# Patient Record
Sex: Female | Born: 1940 | Race: White | Hispanic: No | Smoking: Former smoker
Health system: Southern US, Community
[De-identification: ages and names within clinical notes are randomized; demographics above are authoritative.]

## PROBLEM LIST (undated history)

## (undated) DIAGNOSIS — D126 Benign neoplasm of colon, unspecified: Secondary | ICD-10-CM

## (undated) DIAGNOSIS — A630 Anogenital (venereal) warts: Secondary | ICD-10-CM

## (undated) DIAGNOSIS — I10 Essential (primary) hypertension: Secondary | ICD-10-CM

## (undated) DIAGNOSIS — M1990 Unspecified osteoarthritis, unspecified site: Secondary | ICD-10-CM

## (undated) DIAGNOSIS — K219 Gastro-esophageal reflux disease without esophagitis: Secondary | ICD-10-CM

## (undated) HISTORY — DX: Gastro-esophageal reflux disease without esophagitis: K21.9

## (undated) HISTORY — DX: Anogenital (venereal) warts: A63.0

## (undated) HISTORY — DX: Benign neoplasm of colon, unspecified: D12.6

## (undated) HISTORY — DX: Unspecified osteoarthritis, unspecified site: M19.90

## (undated) HISTORY — DX: Essential (primary) hypertension: I10

## (undated) HISTORY — PX: COLONOSCOPY: SHX174

## (undated) HISTORY — PX: CATARACT EXTRACTION: SUR2

---

## 1975-12-14 HISTORY — PX: VAGINAL HYSTERECTOMY: SUR661

## 1998-09-19 ENCOUNTER — Emergency Department (HOSPITAL_COMMUNITY): Admission: EM | Admit: 1998-09-19 | Discharge: 1998-09-19 | Payer: Self-pay | Admitting: Family Medicine

## 1999-05-14 ENCOUNTER — Emergency Department (HOSPITAL_COMMUNITY): Admission: EM | Admit: 1999-05-14 | Discharge: 1999-05-14 | Payer: Self-pay | Admitting: Emergency Medicine

## 2002-05-15 ENCOUNTER — Ambulatory Visit (HOSPITAL_COMMUNITY): Admission: RE | Admit: 2002-05-15 | Discharge: 2002-05-15 | Payer: Self-pay | Admitting: Internal Medicine

## 2002-08-03 ENCOUNTER — Other Ambulatory Visit: Admission: RE | Admit: 2002-08-03 | Discharge: 2002-08-03 | Payer: Self-pay | Admitting: Obstetrics and Gynecology

## 2004-01-08 ENCOUNTER — Other Ambulatory Visit: Admission: RE | Admit: 2004-01-08 | Discharge: 2004-01-08 | Payer: Self-pay | Admitting: Obstetrics and Gynecology

## 2004-07-07 ENCOUNTER — Other Ambulatory Visit: Admission: RE | Admit: 2004-07-07 | Discharge: 2004-07-07 | Payer: Self-pay | Admitting: Obstetrics and Gynecology

## 2005-07-19 ENCOUNTER — Other Ambulatory Visit: Admission: RE | Admit: 2005-07-19 | Discharge: 2005-07-19 | Payer: Self-pay | Admitting: Obstetrics and Gynecology

## 2005-12-01 ENCOUNTER — Ambulatory Visit: Admission: RE | Admit: 2005-12-01 | Discharge: 2005-12-01 | Payer: Self-pay | Admitting: Gynecology

## 2006-01-11 ENCOUNTER — Ambulatory Visit (HOSPITAL_COMMUNITY): Admission: RE | Admit: 2006-01-11 | Discharge: 2006-01-11 | Payer: Self-pay | Admitting: Family Medicine

## 2006-01-11 ENCOUNTER — Encounter (INDEPENDENT_AMBULATORY_CARE_PROVIDER_SITE_OTHER): Payer: Self-pay | Admitting: *Deleted

## 2006-03-18 ENCOUNTER — Ambulatory Visit: Admission: RE | Admit: 2006-03-18 | Discharge: 2006-03-18 | Payer: Self-pay | Admitting: Gynecology

## 2006-10-17 ENCOUNTER — Other Ambulatory Visit: Admission: RE | Admit: 2006-10-17 | Discharge: 2006-10-17 | Payer: Self-pay | Admitting: Obstetrics and Gynecology

## 2007-08-03 ENCOUNTER — Other Ambulatory Visit: Admission: RE | Admit: 2007-08-03 | Discharge: 2007-08-03 | Payer: Self-pay | Admitting: Obstetrics and Gynecology

## 2008-01-30 ENCOUNTER — Ambulatory Visit: Payer: Self-pay | Admitting: Internal Medicine

## 2008-02-13 ENCOUNTER — Encounter: Payer: Self-pay | Admitting: Internal Medicine

## 2008-02-13 ENCOUNTER — Ambulatory Visit: Payer: Self-pay | Admitting: Internal Medicine

## 2008-03-14 ENCOUNTER — Other Ambulatory Visit: Admission: RE | Admit: 2008-03-14 | Discharge: 2008-03-14 | Payer: Self-pay | Admitting: Obstetrics and Gynecology

## 2009-06-12 ENCOUNTER — Other Ambulatory Visit: Admission: RE | Admit: 2009-06-12 | Discharge: 2009-06-12 | Payer: Self-pay | Admitting: Obstetrics and Gynecology

## 2009-08-05 ENCOUNTER — Ambulatory Visit: Payer: Self-pay | Admitting: Vascular Surgery

## 2009-11-10 ENCOUNTER — Ambulatory Visit: Payer: Self-pay | Admitting: Surgery

## 2010-02-03 ENCOUNTER — Ambulatory Visit: Payer: Self-pay | Admitting: Vascular Surgery

## 2010-05-26 ENCOUNTER — Ambulatory Visit: Payer: Self-pay | Admitting: Vascular Surgery

## 2010-06-29 ENCOUNTER — Ambulatory Visit: Payer: Self-pay | Admitting: Vascular Surgery

## 2010-07-08 ENCOUNTER — Ambulatory Visit: Payer: Self-pay | Admitting: Vascular Surgery

## 2010-08-03 ENCOUNTER — Ambulatory Visit: Payer: Self-pay | Admitting: Vascular Surgery

## 2011-03-18 ENCOUNTER — Other Ambulatory Visit: Payer: Self-pay | Admitting: Obstetrics and Gynecology

## 2011-03-18 ENCOUNTER — Other Ambulatory Visit (HOSPITAL_COMMUNITY)
Admission: RE | Admit: 2011-03-18 | Discharge: 2011-03-18 | Disposition: A | Discharge status: Home / Self Care | Payer: Medicare Other | Source: Ambulatory Visit | Attending: Obstetrics and Gynecology | Admitting: Obstetrics and Gynecology

## 2011-03-18 DIAGNOSIS — Z124 Encounter for screening for malignant neoplasm of cervix: Secondary | ICD-10-CM | POA: Insufficient documentation

## 2011-04-27 NOTE — Assessment & Plan Note (Signed)
OFFICE VISIT   Tammy [name], Tammy [name]  DOB:  [birth year]                                       06/29/2010  ZOXWR#:[id number]   The patient underwent laser ablation of her left great saphenous vein  for venous hypertension and history of thrombophlebitis in the left leg.  She tolerated the procedure well and will return on July 27th for venous  duplex exam, to be seen by Dr. Arbie Cookey at that time for followup.     Quita Skye Hart [place], M.D.  Electronically Signed   JDL/MEDQ  D:  06/29/2010  T:  06/30/2010  Job:  [id number]

## 2011-04-27 NOTE — Procedures (Signed)
DUPLEX DEEP VENOUS EXAM - LOWER EXTREMITY   INDICATION:  Left greater saphenous vein ELAS on 06/29/2010.   HISTORY:  Edema:  Yes  Trauma/Surgery:  Left greater saphenous vein ELAS 06/29/2010  Pain:  Yes  PE:  No  Previous DVT:  Yes  Anticoagulants:  No   DUPLEX EXAM:                CFV   SFV       PopV       PTV   GSV                R  L  R  L      R  L       R  L  R  L  Thrombosis    0  0     P         P          P     +  Spontaneous   +  +     +         +          D     0  Phasic        +  +     +         +          D     0  Augmentation  +  +     +         +          D     0  Compressible  +  +     Partial in distal    Partial in proximal  P                0  Competent     +  +     +         +          +     0   Legend:  + - yes  o - no  p - partial  D - decreased   IMPRESSION:  1. The left leg appears to have traces of old thrombus in distal      superficial femoral artery, proximal popliteal, and peroneal vein      with partial compression and mildly diminished flow.  2. Left greater saphenous vein closed off due to ELAS procedure.  3. Right SFJ and common femoral vein appear to be negative for deep      vein thrombosis.  4. Somewhat limited due to patient body habitus, not all the calf      veins were visualized due to increased edema.    _____________________________  Quita Skye. Hart [place], M.D.   NT/MEDQ  D:  08/04/2010  T:  08/04/2010  Job:  [id number]

## 2011-04-27 NOTE — Assessment & Plan Note (Signed)
OFFICE VISIT   Tammy [name], Tammy [name]  DOB:  11-Nov-1941                                       05/26/2010  ZOXWR#:[id number]   The patient returns today for further followup regarding her venous  insufficiency left greater than right leg.  She has had previous  thrombophlebitis in the left leg and has had painful venous hypertension  in the thigh and calf consisting of aching, throbbing and burning  discomfort.  She has tried wearing long-leg elastic compression  stockings (20 mm - 30 mm gradient) as well as elevating her leg and  trying ibuprofen but has had no success in relieving her symptoms.  She  has had her Coumadin discontinued a few months ago which she had taken  for 8 months for the superficial thrombophlebitis.   On examination today she has an excellent femoral, popliteal and  dorsalis pedis pulse in the left leg with extensive reticular and spider  veins below the knee and some small bulging varicosities in the calf  where the thrombophlebitis occurred.  She does have 1-2+ edema in the  ankle.  A venous duplex exam last visit revealed gross reflux in the  left saphenofemoral junction from the groin to the knee feeding these  areas of venous hypertension.  Deep system had no evidence of DVT and  she does have a +5 dorsalis pedis pulse.  I think the best plan would be  to treat her with laser ablation of her left great saphenous vein and we  will proceed with precertification to perform that in the near future.     Quita Skye Hart [place], M.D.  Electronically Signed   JDL/MEDQ  D:  05/26/2010  T:  05/27/2010  Job:  [id number]

## 2011-04-27 NOTE — Procedures (Signed)
LOWER EXTREMITY VENOUS REFLUX EXAM   INDICATION:  Bilateral lower extremity swelling, pain, varicose veins,  history of DVT and phlebitis.   EXAM:  Using color-flow imaging and pulse Doppler spectral analysis, the  bilateral common femoral, superficial femoral, popliteal, posterior  tibial, greater and lesser saphenous veins are evaluated.  There is no  evidence suggesting deep venous insufficiency in the bilateral lower  extremities.   The bilateral saphenofemoral junction is not competent with reflux of  >570milliseconds. The bilateral GSV is not competent with reflux of  >544milliseconds with the caliber as described below.)   The bilateral proximal short saphenous vein demonstrates competency.   GSV Diameter (used if found to be incompetent only)                                            Right    Left  Proximal Greater Saphenous Vein           0.90 cm  1.15 cm  Proximal-to-mid-thigh                     0.62 cm  0.80 cm  Mid thigh                                 0.58 cm  0.87 cm  Mid-distal thigh                          cm       cm  Distal thigh                              0.47 cm  1.19 cm  Knee                                      0.38 cm  0.65 cm   IMPRESSION:  1. Bilateral greater saphenous vein reflux with >500 milliseconds, is      identified with the caliber ranging from 0.38 cm to 0.90 cm knee to      groin on the right and 0.65 to 1.15 cm on the left.  2. The bilateral greater saphenous vein is not aneurysmal.  3. The bilateral greater saphenous vein is not tortuous.  4. The deep venous system is competent.  5. The bilateral lesser saphenous vein is competent.  6. There is an anterior accessory saphenous vein identified on the      right with reflux of >500 milliseconds measuring 0.62 cm at the      saphenofemoral junction and 0.55 cm to the proximal thigh,      contributing to varicose veins.       ___________________________________________  Quita Skye Hart [place], M.D.   CJ/MEDQ  D:  02/03/2010  T:  02/04/2010  Job:  [id number]

## 2011-04-27 NOTE — Procedures (Signed)
DUPLEX DEEP VENOUS EXAM - LOWER EXTREMITY   INDICATION:  Right lower ext pain/swelling, rule out DVT.   HISTORY:  Edema:  3-4 weeks of swelling at the right knee level  Trauma/Surgery:  no  Pain:  Right knee pain  PE:  no  Previous DVT:  Left lower extremity DVT in 07/2009  Anticoagulants:  yes  Other:   REFERRING PHYSICIAN:  Geoffry Paradise, M.D.   DUPLEX EXAM:                CFV   SFV   PopV  PTV    GSV                R  L  R  L  R  L  R   L  R  L  Thrombosis    o  o  o     o     o      o  Spontaneous   +  +  +     +     +      +  Phasic        +  +  +     +     +      +  Augmentation  +  +  +     +     +      +  Compressible  +  +  +     +     +      +  Competent   Legend:  + - yes  o - no  p - partial  D - decreased   IMPRESSION:  1. No evidence of deep venous thrombosis noted in the right lower      extremity.  2. There is a Baker cyst noted in the left popliteal fossa.   A preliminary report was called to Dr. Lanell Matar office on 11/10/2009  and given to Diane.    _____________________________  V. Charlena Cross, MD   CH/MEDQ  D:  11/11/2009  T:  11/12/2009  Job:  [id number]   cc:   Geoffry Paradise, M.D.

## 2011-04-27 NOTE — Assessment & Plan Note (Signed)
OFFICE VISIT   ORA, BOLLIG  DOB:  1941-01-22                                       07/08/2010  ZOXWR#:[id number]   The patient presents today for followup of her laser ablation of her  left great saphenous vein on 06/29/2010 for venous hypertension.  She  has done extremely well since her procedure with minimal discomfort.  She does have the typical irritation from the compression garment that  she has worn as directed since the procedure.  She has mild bruising in  her medial thigh.   She underwent repeat venous duplex in our office today and this shows  closure of her great saphenous vein from the knee to the saphenofemoral  junction.  She does have evidence of old thrombus in her femoral vein  and popliteal vein and peroneal vein.  This is consistent with the area  of clot when she was diagnosed with DVT 1 year ago.  She does not appear  to have any new evidence of thrombus.  She will continue her compression  for a total of 2 weeks after the procedure and will see [place] again on an  as-needed basis.     Larina Earthly, M.D.  Electronically Signed   TFE/MEDQ  D:  07/08/2010  T:  07/09/2010  Job:  [id number]   cc:   Geoffry Paradise, M.D.  Quita Skye Hart [place], M.D.

## 2011-04-27 NOTE — Procedures (Signed)
DUPLEX DEEP VENOUS EXAM - LOWER EXTREMITY   INDICATION:  Left lower extremity pain and swelling   HISTORY:  Edema:  yes  Trauma/Surgery:  no  Pain:  yes  PE:  no  Previous DVT:  yes  Anticoagulants:  Other:   DUPLEX EXAM:                CFV   SFV   PopV  PTV    GSV                R  L  R  L  R  L  R   L  R   L  Thrombosis    0  0     +     +      +  +   +  Spontaneous      +     0     0      0      0  Phasic           +     0     0      0      +  Augmentation     +     0     0      0      +  Compressible     +     0     0      0      +  Competent        +     0     0      0      +   Legend:  + - yes  o - no  p - partial  D - decreased   IMPRESSION:  Acute deep venous thrombosis noted in the left superficial  femoral vein, popliteal, gastrocnemius and tibial veins.   Notify Diane with results.    _____________________________  Di Kindle. Edilia Bo, M.D.   MG/MEDQ  D:  08/05/2009  T:  08/05/2009  Job:  [id number]

## 2011-04-27 NOTE — Procedures (Signed)
DUPLEX DEEP VENOUS EXAM - LOWER EXTREMITY   INDICATION:  Followup left greater saphenous vein ELAS.   HISTORY:  Edema:  Bilateral.  Trauma/Surgery:  ELAS of left greater saphenous vein on 06/29/2010.  Pain:  No pain just tender to touch.  PE:  No.  Previous DVT:  Yes.  Anticoagulants:  Not currently.  Other:   DUPLEX EXAM:                CFV   SFV       PopV       PTV   GSV                R  L  R  L      R  L       R  L  R  L  Thrombosis    0  0     P         Trace      P     +  Spontaneous   +  +     +         +          D     0  Phasic        +  +     +         +          D     0  Augmentation  +  +     +         +          D     0                 Compressible      +     +           Partial  in distal        Partial in proximal        P     0  Competent     +  +     +         +          +     0   Legend:  + - yes  o - no  p - partial  D - decreased   IMPRESSION:  1. The left leg appears to have traces of old thrombus in the distal      superficial femoral vein, proximal popliteal vein and in the      peroneal veins with partial compression and mildly diminished flow.  2. The left greater saphenous vein was closed off with recent ELAS      procedure.  3. The right saphenofemoral junction and common femoral vein appear to      be negative for deep venous thrombosis.  4. Somewhat limited due to patient's body habitus, not all the calf      veins were visualized.    _____________________________  Larina Earthly, M.D.   NT/MEDQ  D:  07/08/2010  T:  07/08/2010  Job:  [id number]

## 2011-04-27 NOTE — Consult Note (Signed)
NEW PATIENT CONSULTATION   Tammy, [name]  DOB:  1941/02/18                                       02/03/2010  ZOXWR#:[id number]   The patient is a 70 year old female referred by Dr. Jacky Kindle for painful,  swollen left lower extremity with a history of superficial  thrombophlebitis in the past.  This patient has been having fairly  constant swelling and discomfort in the left thigh and calf area over  the last several months with a throbbing and aching discomfort.  She  developed swelling in the left ankle which worsens as the day  progresses.  She has tried short leg elastic compression stockings with  no improvement.  She had an episode of superficial thrombophlebitis and  was placed on Coumadin therapy in August 2010 and her symptoms have  persisted.  She has minimal symptoms the contralateral right leg.   CHRONIC MEDICAL PROBLEMS:  1. Hypertension.  2. Hyperlipidemia.  3. History of thrombophlebitis currently on Coumadin.  4. Type 2 diabetes mellitus.   PAST SURGICAL HISTORY:  Hysterectomy.   FAMILY HISTORY:  Positive for coronary artery disease in her mother and  brothers and a son who had an MI at age 45.  Negative for diabetes and  stroke.   SOCIAL HISTORY:  She is widowed, has three children.  Has not smoked  since 1987 and does not use alcohol.   REVIEW OF SYSTEMS:  Negative for weight loss, anorexia, chest pain,  dyspnea on exertion.  Has occasional palpitations.  History of joint  pain and the venous symptoms as noted above.  All other systems are  negative in the review of systems.   PHYSICAL EXAMINATION:  Vital signs:  Blood pressure 123/76, heart rate  74, temperature 97.9.  General:  She is a well-developed, well-nourished  female in no apparent distress.  HEENT:  EOMs intact.  Conjunctive  normal.  Lungs:  Are clear.  No wheezing.  Cardiovascular:  Regular  rhythm.  No murmurs.  Neck:  Neck is supple with carotid pulses 3+, no  bruits.   Abdomen:  Soft, nontender with no masses.  Musculoskeletal:  Exam revealed no major deformities.  Neurological:  Normal.  Skin:  Free  of rashes.  Lower extremity:  Exam reveals evidence of venous  hypertension in both legs, left worse than right with prominent  reticular and spider veins particularly in the distal thigh and proximal  calf and one to two plus edema in the left ankle area, one plus on the  right.  No evidence of any gangrene or infection.  She has 2+ dorsalis  pedis and posterior tibial pulses bilaterally.   Today I ordered a venous duplex exam which I reviewed and interpreted.  Left leg has gross reflux in the saphenofemoral junction to the knee  with a large saphenous vein, few areas which appear to have been  recanalized from previous thrombophlebitis but are patent.  Deep venous  system is normal on the left, no DVT.  Right leg has no DVT in the deep  system and also has some reflux in the great saphenous system although  the vein is smaller than on the left side.  Small saphenous veins are  normal bilaterally.   I think this patient is having pain in the left leg from venous  hypertension.  We will treat her  with long leg elastic compression  stockings (20 mm - 30 mm gradient) as well as elevation of the legs and  ibuprofen on a daily basis.  She will return in 3 months.  If there has  been no improvement I think she would be a good candidate for laser  ablation of the left great saphenous vein with multiple stab  phlebectomies.  Will see her back in 3 months for further followup.     Quita Skye Hart [place], M.D.  Electronically Signed   JDL/MEDQ  D:  02/03/2010  T:  02/04/2010  Job:  [id number]   cc:   Geoffry Paradise, M.D.

## 2011-04-27 NOTE — Assessment & Plan Note (Signed)
OFFICE VISIT   Tammy [name], Tammy [name]  DOB:  1941/10/16                                       08/03/2010  ZOXWR#:[id number]   The patient returns today complaining of some edema in the left leg  below the knee which has progressed slightly since her procedure done on  July 18 in which she had laser ablation of left great saphenous vein.  She had no discomfort associated with the laser ablation procedure in  the thigh or inguinal area.  States the swelling in the left leg is  slightly worse than it was prior to her procedure.  She is continuing to  wear short-leg elastic compression stockings.  She does have a remote  history of DVT in the past with evidence of this in her previous  ultrasound studies.   PHYSICAL EXAMINATION:  On examination today blood pressure 131/81, heart  rate 96, respirations 18.  She has 3+ femoral, popliteal and dorsalis  pedis pulses bilaterally.  Left leg has mild edema from the knee to the  ankle with no bulging varicosities noted.  There is no stasis ulcers or  infection.   Today I ordered venous duplex exam which I have reviewed and  interpreted.  There is no evidence of any new changes in her deep  system.  She does have some evidence of old DVT in the left superficial  femoral vein, but nonocclusive and unchanged from previous studies.  Left great saphenous vein remained closed and there is no evidence of  DVT.  I reassured her regarding these findings.  She will continue to  elevate the foot of her bed at night while sleeping and place elastic  compression stockings on early in the morning.  She will return in a few  months for sclerotherapy of her spider and reticular veins which she had  discussed previously.     Quita Skye Hart [place], M.D.  Electronically Signed   JDL/MEDQ  D:  08/03/2010  T:  08/04/2010  Job:  [id number]

## 2011-04-30 NOTE — Consult Note (Signed)
Tammy [name], Tammy [name] NO.:  [id number]   MEDICAL RECORD NO.:  [id number]          PATIENT TYPE:  OUT   LOCATION:  GYN                          FACILITY:  [facility]   PHYSICIAN:  De Blanch, M.D.DATE OF BIRTH:  09-26-1941   DATE OF CONSULTATION:  03/18/2006  DATE OF DISCHARGE:                                   CONSULTATION   CHIEF COMPLAINT:  Postoperative check.   INTERVAL HISTORY:  The patient underwent CO2 laser vaporization of 3 vulvar  lesions as well as a wide local excision of the left posterior vulva on  January 11, 2006.  She has had an uncomplicated postoperative course using  Sitz baths and Silvadene cream.  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  Today the patient reports she has completely healed and  has returned to full levels of activity.   PHYSICAL EXAMINATION:  VITAL SIGNS: Weight 239 pounds.  PELVIC:  External genitalia shows that all of the laser sites are well  healed as well as the area of wide local excision on the left posterior  vault.  There are no new lesions noted.   IMPRESSION:  VIN III status post laser vaporization and excision.  She has  healed well and can return to full levels of activity.  We will return her  to the care of her primary gynecologist, Dr. Artist Pais, and would  recommend the patient be examined every 6 months to exclude recurrent  disease.  It is noted the patient has a remote history of approximately 7  years ago of having a similar problem.  The patient is also aware of  symptoms associated with VIN III and will report to Dr. Thomasena Edis if she  develops any symptoms in the interval period.      De Blanch, M.D.  Electronically Signed     DC/MEDQ  D:  03/18/2006  T:  03/19/2006  Job:  [id number]   cc:   Artist Pais, M.D.  Fax: [phone]   Telford Nab, R.N.  [address]  [place], [place] [address]

## 2011-04-30 NOTE — Op Note (Signed)
Tammy [name], Tammy [name]                 ACCOUNT NO.:  [id number]   MEDICAL RECORD NO.:  [id number]          PATIENT TYPE:  AMB   LOCATION:  DAY                          [facility]ACILITY:  [facility]   PHYSICIAN:  De Blanch, M.D.DATE OF BIRTH:  1941/07/18   DATE OF PROCEDURE:  01/11/2006  DATE OF DISCHARGE:                                 OPERATIVE REPORT   PREOPERATIVE DIAGNOSIS:  Multifocal vulvar intraepithelial neoplasia grade  3.   POSTOPERATIVE DIAGNOSIS:  Multifocal vulvar intraepithelial neoplasia grade  3.   PROCEDURE:  CO2 laser vaporization of three vulvar lesions and wide local  excision of a left posterior vulvar lesion.   SURGEON:  De Blanch, M.D.   FIRST ASSISTANT:  Telford Nab, R.N.   ANESTHESIA:  General with oral tracheal tube.   ESTIMATED BLOOD LOSS:  Minimal.   FINDINGS:  Examination under anesthesia revealed a vulva which had  previously undergone extensive laser vaporization many years ago. After  applying acetic acid, there was dense white skin just to the right of the  clitoris. There were multiple lesions within approximately 3-1/2 cm area to  the left of the anterior vulva and a smaller lesion about the mid left  vulva. Finally there was a thickened area of white epithelium in the left  posterior vulva which ultimately was excised. She had small hemorrhoids, no  other abnormalities were noted.   DESCRIPTION OF PROCEDURE:  The patient was brought to the operating room and  after satisfactory attainment of general anesthesia was placed in lithotomy  position in [place] stirrups. The perineum, vagina, vulva and anus were  prepped with Betadine and the patient was draped. Acetic acid was then used  to identify the vulvar lesions which had been previously identified on our  visit with the patient December 01, 2005. The lesions were identified as  noted above.   Attention was first turned to the left posterior vulvar lesion which was  excised  in an elliptical fashion removing skin and a minimal amount of  subcutaneous tissue. The apex of the ellipse was marked with a suture for  pathologic orientation. The subcutaneous tissue was cauterized at bleeding  points and then 2-0 Vicryl was used to perform a subcuticular interrupted  closure. Three additional 3-0 Vicryl sutures were used as a mattress suture  through and through the skin.   Attention was turned to the anterior vulvar lesions which were lasered with  the CO2 laser. The CO2 laser was set at 20 watts of power in a continuous  mode. We lasered all areas with approximately a 5 mm margin around the gross  lesions. We lasered to the depth of the second surgical plane. All char was  washed away with acetic acid. Hemostasis was excellent, Silvadene cream was  applied. The patient was awakened from anesthesia and taken to the recovery  room in satisfactory condition. Sponge, needle and instrument counts correct  x2.      De Blanch, M.D.  Electronically Signed     DC/MEDQ  D:  01/11/2006  T:  01/12/2006  Job:  [id number]   cc:  Telford Nab, R.N.  [address]  [place], [place] [address]   Artist Pais, M.D.  Fax: [phone]

## 2011-04-30 NOTE — Consult Note (Signed)
[name], [name] NO.:  [id number]   MEDICAL RECORD NO.:  [id number]          PATIENT TYPE:  OUT   LOCATION:  GYN                          FACILITY:  [facility]   PHYSICIAN:  De Blanch, M.D.DATE OF BIRTH:  1941/03/21   DATE OF CONSULTATION:  12/01/2005  DATE OF DISCHARGE:  12/01/2005                                   CONSULTATION   CHIEF COMPLAINT:  High-grade dysplasia of the vulva.   HISTORY OF PRESENT ILLNESS:  The patient has had a longstanding history of  VIN 3, initially treated for several years by Dr. Verlon Au.  She  apparently had a partial vulvectomy at one point and subsequently 2  extensive laser procedures.  The patient has recently seen Dr. Artist Pais  who has identified lesions to the right side of the clitoris which on biopsy  is VIN 3.  She also obtained a biopsy of her raised, thickened area to the  right posterior vulva which showed mild dysplasia.  The patient herself is  asymptomatic at the present time.   PAST MEDICAL HISTORY:   MEDICAL ILLNESSES:  Hypertension and obesity.   DRUG ALLERGIES:  None.   CURRENT MEDICATIONS:  Benicar, Norvasc, and clobetasol cream p.r.n.   PAST SURGICAL HISTORY:  1.  Partial vulvectomy.  2.  Laser vaporization of the vulva on 2 occasions.  3.  Hysterectomy in 1977.   FAMILY HISTORY:  The patient has a mother with breast cancer.   OBSTETRICAL HISTORY:  Gravida 3.   SOCIAL HISTORY:  The patient is retired.  She does not smoke.   REVIEW OF SYSTEMS:  Ten-point comprehensive review of systems performed, is  negative except as noted above.   PHYSICAL EXAMINATION:  VITAL SIGNS:  Weight 239 pounds, height 5 feet 7  inches, blood pressure 120/80, pulse 80. GENERAL:  The patient is a healthy  white female in no acute distress.  HEENT is negative.  NECK:  Supple without thyromegaly.  There is no supraclavicular or inguinal  adenopathy.  ABDOMEN:  Soft, nontender.  No masses or  organomegaly, ascites, or hernias  are noted.  LOWER EXTREMITIES:  Without edema or varicosities.  PELVIC EXAM:  EG/BUS shows changes in the skin which seem to be from  extensive laser vaporization, especially in the posterior vulva and perianal  area.  She has an area of thickened skin at approximately 5 o'clock on the  right posterior vulva which has previously been biopsied.  Careful  inspection of the vulva after application of acetic acid shows an area of  white epithelium approximately 1 cm in diameter to the right of the clitoris  and a very focal area of white epithelium at the mid introitus on the left  side.   Colposcopic examination is performed following application of acetic acid,  and no additional lesions are noted.   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   PLAN:  I would recommend she had laser vaporization of the 2 upper lesions  and a wide excision of the posterior lesion.  We have arranged for this to  be performed on January 11, 2006 as an outpatient.      De Blanch, M.D.  Electronically Signed     DC/MEDQ  D:  12/01/2005  T:  12/04/2005  Job:  [id number]   cc:   Artist Pais, M.D.  Fax: [phone]   Telford Nab, R.N.  [address]  [place], [place] [address]

## 2011-07-02 ENCOUNTER — Encounter: Payer: Self-pay | Admitting: Internal Medicine

## 2011-08-13 ENCOUNTER — Encounter: Payer: Self-pay | Admitting: Internal Medicine

## 2011-08-13 ENCOUNTER — Ambulatory Visit (AMBULATORY_SURGERY_CENTER): Payer: Medicare Other | Admitting: *Deleted

## 2011-08-13 VITALS — Ht 67.0 in | Wt 243.0 lb

## 2011-08-13 DIAGNOSIS — Z1211 Encounter for screening for malignant neoplasm of colon: Secondary | ICD-10-CM

## 2011-08-13 MED ORDER — SUPREP BOWEL PREP KIT 17.5-3.13-1.6 GM/177ML PO SOLN: 1.0000 | ORAL | Status: DC

## 2011-08-27 ENCOUNTER — Other Ambulatory Visit: Payer: Medicare Other | Admitting: Internal Medicine

## 2011-09-01 ENCOUNTER — Ambulatory Visit (AMBULATORY_SURGERY_CENTER): Payer: Medicare Other | Admitting: Internal Medicine

## 2011-09-01 ENCOUNTER — Encounter: Payer: Self-pay | Admitting: Internal Medicine

## 2011-09-01 VITALS — BP 133/65 | HR 54 | Temp 98.5°F | Resp 18 | Ht 67.0 in | Wt 243.0 lb

## 2011-09-01 DIAGNOSIS — Z1211 Encounter for screening for malignant neoplasm of colon: Secondary | ICD-10-CM

## 2011-09-01 DIAGNOSIS — K635 Polyp of colon: Secondary | ICD-10-CM

## 2011-09-01 DIAGNOSIS — D126 Benign neoplasm of colon, unspecified: Secondary | ICD-10-CM

## 2011-09-01 DIAGNOSIS — Z86010 Personal history of colonic polyps: Secondary | ICD-10-CM

## 2011-09-01 MED ORDER — SODIUM CHLORIDE 0.9 % IV SOLN: 500.0000 mL | INTRAVENOUS | Status: DC

## 2011-09-01 NOTE — Patient Instructions (Signed)
Discharge instructions given with verbal understanding. Handouts on polyps given. Resume previous medications. 

## 2011-09-02 ENCOUNTER — Telehealth: Payer: Self-pay | Admitting: *Deleted

## 2011-09-02 NOTE — Telephone Encounter (Signed)
Follow up Call- Patient questions:  Do you have a fever, pain , or abdominal swelling? no Pain Score  0 *  Have you tolerated food without any problems? no  Have you been able to return to your normal activities? yes  Do you have any questions about your discharge instructions: Diet   no Medications  no Follow up visit  no  Do you have questions or concerns about your Care? no  Actions: * If pain score is 4 or above: No action needed, pain <4. 

## 2011-10-11 ENCOUNTER — Other Ambulatory Visit: Payer: Self-pay | Admitting: Obstetrics and Gynecology

## 2011-10-11 DIAGNOSIS — R609 Edema, unspecified: Secondary | ICD-10-CM

## 2011-10-12 ENCOUNTER — Ambulatory Visit
Admission: RE | Admit: 2011-10-12 | Discharge: 2011-10-12 | Disposition: A | Discharge status: Home / Self Care | Payer: Medicare Other | Source: Ambulatory Visit | Attending: Obstetrics and Gynecology | Admitting: Obstetrics and Gynecology

## 2011-10-12 DIAGNOSIS — R609 Edema, unspecified: Secondary | ICD-10-CM

## 2012-05-03 ENCOUNTER — Other Ambulatory Visit: Payer: Self-pay | Admitting: Obstetrics and Gynecology

## 2012-05-03 ENCOUNTER — Other Ambulatory Visit (HOSPITAL_COMMUNITY)
Admission: RE | Admit: 2012-05-03 | Discharge: 2012-05-03 | Disposition: A | Discharge status: Home / Self Care | Payer: Medicare Other | Source: Ambulatory Visit | Attending: Obstetrics and Gynecology | Admitting: Obstetrics and Gynecology

## 2012-05-03 DIAGNOSIS — Z01419 Encounter for gynecological examination (general) (routine) without abnormal findings: Secondary | ICD-10-CM | POA: Insufficient documentation

## 2012-05-03 DIAGNOSIS — R87810 Cervical high risk human papillomavirus (HPV) DNA test positive: Secondary | ICD-10-CM | POA: Insufficient documentation

## 2012-11-30 ENCOUNTER — Other Ambulatory Visit (HOSPITAL_COMMUNITY): Payer: Self-pay | Admitting: Cardiovascular Disease

## 2012-12-12 ENCOUNTER — Ambulatory Visit (HOSPITAL_COMMUNITY)
Admission: RE | Admit: 2012-12-12 | Discharge: 2012-12-12 | Disposition: A | Discharge status: Home / Self Care | Payer: Medicare Other | Source: Ambulatory Visit | Attending: Cardiovascular Disease | Admitting: Cardiovascular Disease

## 2012-12-12 DIAGNOSIS — R002 Palpitations: Secondary | ICD-10-CM | POA: Insufficient documentation

## 2012-12-12 DIAGNOSIS — Z8249 Family history of ischemic heart disease and other diseases of the circulatory system: Secondary | ICD-10-CM | POA: Insufficient documentation

## 2012-12-12 DIAGNOSIS — Z87891 Personal history of nicotine dependence: Secondary | ICD-10-CM | POA: Insufficient documentation

## 2012-12-12 DIAGNOSIS — I10 Essential (primary) hypertension: Secondary | ICD-10-CM | POA: Insufficient documentation

## 2012-12-12 DIAGNOSIS — R0609 Other forms of dyspnea: Secondary | ICD-10-CM | POA: Insufficient documentation

## 2012-12-12 DIAGNOSIS — R0989 Other specified symptoms and signs involving the circulatory and respiratory systems: Secondary | ICD-10-CM | POA: Insufficient documentation

## 2012-12-12 DIAGNOSIS — E669 Obesity, unspecified: Secondary | ICD-10-CM | POA: Insufficient documentation

## 2012-12-12 MED ORDER — TECHNETIUM TC 99M SESTAMIBI GENERIC - CARDIOLITE
30.5000 | Freq: Once | INTRAVENOUS | Status: AC | PRN
  Administered 2012-12-12: 31 via INTRAVENOUS

## 2012-12-12 MED ORDER — REGADENOSON 0.4 MG/5ML IV SOLN
0.4000 mg | Freq: Once | INTRAVENOUS | Status: AC
  Administered 2012-12-12: 0.4 mg via INTRAVENOUS

## 2012-12-12 MED ORDER — TECHNETIUM TC 99M SESTAMIBI GENERIC - CARDIOLITE
10.4000 | Freq: Once | INTRAVENOUS | Status: AC | PRN
  Administered 2012-12-12: 10 via INTRAVENOUS

## 2012-12-12 NOTE — Procedures (Addendum)
[facility] [address] [place] [address] [phone]  Cardiology Nuclear Med Study  Tammy [name] is a 71 y.o. female     MRN : [id number]     DOB: 1941-09-08  Procedure Date: 12/12/2012  Nuclear Med Background Indication for Stress Test:  Evaluation for Ischemia History:  no prior cardiac history Cardiac Risk Factors: Family History - CAD, History of Smoking, Hypertension and Obesity  Symptoms:  DOE and Palpitations   Nuclear Pre-Procedure Caffeine/Decaff Intake:  12:00am NPO After: 11:00am   IV Site: R Antecubital  IV 0.9% NS with Angio Cath:  22g  Chest Size (in):  n/a IV Started by: Koren Shiver, CNMT  Height: 5\' 7"  (1.702 m)  Cup Size: D  BMI:  Body mass index is 37.28 kg/(m^2). Weight:  238 lb (107.956 kg)   Tech Comments:  n/a    Nuclear Med Study 1 or 2 day study: 1 day  Stress Test Type:  Lexiscan  Order Authorizing Provider:  Nanetta Batty, MD   Resting Radionuclide: Technetium 34m Sestamibi  Resting Radionuclide Dose: 10.4 mCi   Stress Radionuclide:  Technetium 68m Sestamibi  Stress Radionuclide Dose: 30.5 mCi           Stress Protocol Rest HR: 57 Stress HR: 76  Rest BP: 117/77 Stress BP: 152/68  Exercise Time (min): n/a METS: n/a   Predicted Max HR: 147 bpm % Max HR: 51.7 bpm Rate Pressure Product: 86578   Dose of Adenosine (mg):  n/a Dose of Lexiscan: 0.4 mg  Dose of Atropine (mg): n/a Dose of Dobutamine: n/a mcg/kg/min (at max HR)  Stress Test Technologist: Esperanza Sheets, CCT Nuclear Technologist: Gonzella Lex, CNMT   Rest Procedure:  Myocardial perfusion imaging was performed at rest 45 minutes following the intravenous administration of Technetium 71m Sestamibi. Stress Procedure:  The patient received IV Lexiscan 0.4 mg over 15-seconds.  Technetium 72m Sestamibi injected at 30-seconds.  There were no significant changes with Lexiscan.  Quantitative spect images were obtained after  a 45 minute delay.  Transient Ischemic Dilatation (Normal <1.22):  0.95  Lung/Heart Ratio (Normal <0.45):  0.30 QGS EDV:  78 ml QGS ESV:  25 ml LV Ejection Fraction: 68%  Signed by      Rest ECG: NSR with poor R wave progression  Stress ECG: No significant change from baseline ECG and There are scattered PACs.  QPS Raw Data Images:  Mild breast attenuation.  Normal left ventricular size. Stress Images:  Normal homogeneous uptake in all areas of the myocardium. Rest Images:  Comparison with the stress images reveals no significant change. Subtraction (SDS):  There is a very small fixed anterior defect that is most consistent with breast attenuation. LV Wall Motion:  NL LV Function; NL Wall Motion EF 68%  Impression Exercise Capacity:  Lexiscan with no exercise. BP Response:  Normal blood pressure response. Clinical Symptoms:  No significant symptoms noted. ECG Impression:  No significant ST segment change suggestive of ischemia. Comparison with Prior Nuclear Study: No previous nuclear study performed  Overall Impression:  Normal stress nuclear study.   Thurmon Fair, MD  12/12/2012 5:00 PM

## 2013-04-13 ENCOUNTER — Encounter: Payer: Self-pay | Admitting: Cardiovascular Disease

## 2013-11-27 ENCOUNTER — Other Ambulatory Visit: Payer: Self-pay | Admitting: Obstetrics and Gynecology

## 2013-11-27 ENCOUNTER — Other Ambulatory Visit (HOSPITAL_COMMUNITY)
Admission: RE | Admit: 2013-11-27 | Discharge: 2013-11-27 | Disposition: A | Discharge status: Home / Self Care | Payer: Medicare Other | Source: Ambulatory Visit | Attending: Obstetrics and Gynecology | Admitting: Obstetrics and Gynecology

## 2013-11-27 DIAGNOSIS — Z124 Encounter for screening for malignant neoplasm of cervix: Secondary | ICD-10-CM | POA: Insufficient documentation

## 2014-11-27 ENCOUNTER — Other Ambulatory Visit (HOSPITAL_COMMUNITY)
Admission: RE | Admit: 2014-11-27 | Discharge: 2014-11-27 | Disposition: A | Discharge status: Home / Self Care | Payer: Medicare Other | Source: Ambulatory Visit | Attending: Obstetrics and Gynecology | Admitting: Obstetrics and Gynecology

## 2014-11-27 ENCOUNTER — Other Ambulatory Visit: Payer: Self-pay | Admitting: Obstetrics and Gynecology

## 2014-11-27 DIAGNOSIS — Z124 Encounter for screening for malignant neoplasm of cervix: Secondary | ICD-10-CM | POA: Diagnosis present

## 2014-12-02 LAB — CYTOLOGY - PAP

## 2015-07-09 ENCOUNTER — Encounter: Payer: Self-pay | Admitting: *Deleted

## 2015-08-11 ENCOUNTER — Encounter: Payer: Self-pay | Admitting: Cardiovascular Disease

## 2015-12-02 ENCOUNTER — Other Ambulatory Visit: Payer: Self-pay | Admitting: Obstetrics and Gynecology

## 2015-12-02 ENCOUNTER — Other Ambulatory Visit (HOSPITAL_COMMUNITY)
Admission: RE | Admit: 2015-12-02 | Discharge: 2015-12-02 | Disposition: A | Discharge status: Home / Self Care | Payer: Medicare Other | Source: Ambulatory Visit | Attending: Obstetrics and Gynecology | Admitting: Obstetrics and Gynecology

## 2015-12-02 DIAGNOSIS — R87810 Cervical high risk human papillomavirus (HPV) DNA test positive: Secondary | ICD-10-CM | POA: Insufficient documentation

## 2015-12-02 DIAGNOSIS — Z01419 Encounter for gynecological examination (general) (routine) without abnormal findings: Secondary | ICD-10-CM | POA: Insufficient documentation

## 2015-12-02 DIAGNOSIS — Z1151 Encounter for screening for human papillomavirus (HPV): Secondary | ICD-10-CM | POA: Diagnosis present

## 2015-12-12 LAB — CYTOLOGY - PAP

## 2016-05-12 NOTE — Progress Notes (Signed)
Cardiology Office Note   Date:  05/13/2016   ID:  Tammy [name], DOB 11/10/1941, MRN [id number]  PCP:  Geoffery Lyons, MD  Cardiologist:   Minus Breeding, MD   No chief complaint on file.     History of Present Illness: Tammy [name] is a 75 y.o. female who presents for evaluation of palpitations. She's had a long history of these. However, they've not been particularly symptomatic. However more recently she's noticing increasing palpitations. These are sporadic. They don't occur daily. However when she does have more irregular heartbeat she feels very fatigued. It happens at rest. She feels her heart skipping multiple beats sometimes in a row sometimes more sporadically. She does not have presyncope or syncope. She does not have chest pressure, neck or arm discomfort. She does not have shortness of breath, PND or orthopnea. She's not very active. She does grocery shopping rarely as her most physical activity. She doesn't have any limitations with this.    She's had no past cardiac history. She's had a couple of stress test. Last one was 2013 and was normal.   Past Medical History  Diagnosis Date  . Arthritis   . GERD (gastroesophageal reflux disease)   . Hypertension   . Diabetes mellitus     type II controlled with diet  . HPV (human papilloma virus) anogenital infection   . Adenomatous colon polyp     Past Surgical History  Procedure Laterality Date  . Vaginal hysterectomy  1977  . Colonoscopy    . Cataract extraction       Current Outpatient Prescriptions  Medication Sig Dispense Refill  . aspirin 81 MG tablet Take 81 mg by mouth daily.      . Biotin 5000 MCG TABS Take 1 tablet by mouth daily.      Marland Kitchen BYSTOLIC 10 MG tablet Take 5 mg by mouth Daily.    . calcium citrate (CALCITRATE - DOSED IN MG ELEMENTAL CALCIUM) 950 MG tablet Take 1 tablet by mouth daily.      Marland Kitchen CINNAMON PO Take 1,000 mg by mouth daily.      Marland Kitchen losartan-hydrochlorothiazide (HYZAAR) 100-25 MG per  tablet Take 1 tablet by mouth Daily.    . Multiple Vitamins-Minerals (MULTI FOR HER 50+ PO) Take 1 tablet by mouth daily.      . naproxen (NAPROSYN) 500 MG tablet Take 1 tablet by mouth as directed.  3  . Potassium (POTASSIMIN PO) Take 1 tablet by mouth as directed.    . ranitidine (ZANTAC) 150 MG tablet Take 1 tablet by mouth Twice daily.     No current facility-administered medications for this visit.    Allergies:   Doxycycline    Social History:  The patient  reports that she has quit smoking. Her smoking use included Cigarettes. She has a 7.26 pack-year smoking history. She has never used smokeless tobacco. She reports that she does not drink alcohol or use illicit drugs.   Family History:  The patient's family history includes Cancer in her brother and mother; Colon polyps in her maternal uncle; Heart attack (age of onset: 71) in her maternal grandfather and mother; Heart attack (age of onset: 13) in her brother; Stroke (age of onset: 46) in her maternal grandmother.    ROS:  Please see the history of present illness.   Otherwise, review of systems are positive for snoring, mild lower extremity edema.   All other systems are reviewed and negative.    PHYSICAL EXAM:  VS:  BP 146/76 mmHg  Pulse 68  Ht 5\' 7"  (1.702 m)  Wt 244 lb 12.8 oz (111.041 kg)  BMI 38.33 kg/m2 , BMI Body mass index is 38.33 kg/(m^2). GENERAL:  Well appearing HEENT:  Pupils equal round and reactive, fundi not visualized, oral mucosa unremarkable NECK:  No jugular venous distention, waveform within normal limits, carotid upstroke brisk and symmetric, no bruits, no thyromegaly LYMPHATICS:  No cervical, inguinal adenopathy LUNGS:  Clear to auscultation bilaterally BACK:  No CVA tenderness CHEST:  Unremarkable HEART:  PMI not displaced or sustained,S1 and S2 within normal limits, no S3, no S4, no clicks, no rubs, no murmurs ABD:  Flat, positive bowel sounds normal in frequency in pitch, no bruits, no rebound, no  guarding, no midline pulsatile mass, no hepatomegaly, no splenomegaly EXT:  2 plus pulses throughout, mild bilateral edema, no cyanosis no clubbing SKIN:  No rashes no nodules NEURO:  Cranial nerves II through XII grossly intact, motor grossly intact throughout PSYCH:  Cognitively intact, oriented to person place and time    EKG:  EKG is ordered today. The ekg ordered today demonstrates sinus rhythm, rate 68, premature atrial contractions, intervals within normal limits, no acute ST-T wave changes.   Recent Labs: No results found for requested labs within last 365 days.    Lipid Panel No results found for: CHOL, TRIG, HDL, CHOLHDL, VLDL, LDLCALC, LDLDIRECT    Wt Readings from Last 3 Encounters:  05/13/16 244 lb 12.8 oz (111.041 kg)  12/12/12 238 lb (107.956 kg)  09/01/11 243 lb (110.224 kg)      Other studies Reviewed: Additional studies/ records that were reviewed today include: Office records. Review of the above records demonstrates:  Please see elsewhere in the note.     ASSESSMENT AND PLAN:  Palpitations:  The patient has atrial ectopy on her EKG. However, she could be describing the different dysrhythmia. She is to have a monitor.  [name] will need a 21 day event monitor.  The patients symptoms necessitate an event monitor.  The symptoms are too infrequent to be identified on a Holter monitor.    I did review her office records and her TSH was normal and electrolytes normal recently. For now she will remain on the meds as listed.  HTN:  Her blood pressures well controlled and she'll continue the meds as listed.  DM:  She has an A1c of 6.1. She has diet-controlled diabetes. No change in therapy is indicated.  WEIGHT:  The patient understands the need to lose weight with diet and exercise. We have discussed specific strategies for this.  She has a very sedentary lifestyle and we talked about this. I gave her specific suggestions on exercise.   Current medicines  are reviewed at length with the patient today.  The patient does not have concerns regarding medicines.  The following changes have been made:  no change  Labs/ tests ordered today include:   Orders Placed This Encounter  Procedures  . Cardiac event monitor  . EKG 12-Lead     Disposition:   FU with me in one month.     Signed, Minus Breeding, MD  05/13/2016 10:40 AM    [place]

## 2016-05-13 ENCOUNTER — Ambulatory Visit (INDEPENDENT_AMBULATORY_CARE_PROVIDER_SITE_OTHER): Payer: Medicare Other

## 2016-05-13 ENCOUNTER — Ambulatory Visit (INDEPENDENT_AMBULATORY_CARE_PROVIDER_SITE_OTHER): Payer: Medicare Other | Admitting: Cardiology

## 2016-05-13 ENCOUNTER — Encounter: Payer: Self-pay | Admitting: Cardiology

## 2016-05-13 VITALS — BP 146/76 | HR 68 | Ht 67.0 in | Wt 244.8 lb

## 2016-05-13 DIAGNOSIS — R002 Palpitations: Secondary | ICD-10-CM | POA: Insufficient documentation

## 2016-05-13 NOTE — Patient Instructions (Signed)
Medication Instructions:  Continue current medications  Labwork: NONE  Testing/Procedures: Your physician has recommended that you wear an event monitor for 21 days. Event monitors are medical devices that record the heart's electrical activity. Doctors most often [place] these monitors to diagnose arrhythmias. Arrhythmias are problems with the speed or rhythm of the heartbeat. The monitor is a small, portable device. You can wear one while you do your normal daily activities. This is usually used to diagnose what is causing palpitations/syncope (passing out).   Follow-Up: Your physician recommends that you schedule a follow-up appointment in: 1 Month   Any Other Special Instructions Will Be Listed Below (If Applicable).  If you need a refill on your cardiac medications before your next appointment, please call your pharmacy.

## 2016-05-14 ENCOUNTER — Telehealth: Payer: Self-pay | Admitting: Cardiology

## 2016-05-14 NOTE — Telephone Encounter (Signed)
Returned call. Pt explains due to large amount of breast tissue and not sleeping in a bra, she was having issue w monitor staying on last night while sleeping. She has had no problems w/ monitor contact today. Due to frustration w trying to get monitor to stick last night, she resolved not to wear it during sleep. I attempted to help troubleshoot the issue for her, but she requested to speak to Dr. Rosezella [place] nurse since the monitor was put on during office visit.  Will route to [place].   Pt trying to get help before weekend. She is aware she may call [employer] to assist but wished for a call from our office.

## 2016-05-14 NOTE — Telephone Encounter (Signed)
New message   Patient is asking for a call back today .     Event monitor was place on yesterday  - having trouble with patches.

## 2016-05-17 NOTE — Telephone Encounter (Signed)
Patient came in the office today with the monitor.  She stated she had tried everything to get monitor to make contact and it would not She took everything off and started over with no success.  Placed another monitor on patient and called [employer] to give new serial number [id number], spoke with Horris Latino Did recommend patient call [employer] 800 number if any other problems and they be able to assist her

## 2016-06-02 DIAGNOSIS — R002 Palpitations: Secondary | ICD-10-CM | POA: Diagnosis not present

## 2016-07-08 NOTE — Progress Notes (Signed)
Cardiology Office Note   Date:  07/09/2016   ID:  Tammy [name], DOB July 04, 1941, MRN [id number]  PCP:  Geoffery Lyons, MD  Cardiologist:   Minus Breeding, MD   Chief Complaint  Patient presents with  . Palpitations      History of Present Illness: Tammy [name] is a 75 y.o. female who presents for evaluation of palpitations.  After the first visit I sent her for an event monitor.   She had rare palpitations with PACs and PVCs and it was suggested that if she was symptomatic she could try to increase the Bystolic to 7.5 mg daily.  She returns for follow up.  She did not need the increased beta blocker.  She thinks that her palpitations are much better.  The patient denies any new symptoms such as chest discomfort, neck or arm discomfort. There has been no new shortness of breath, PND or orthopnea. There have been no reported palpitations, presyncope or syncope.  She's had no past cardiac history. She's had a couple of stress tests. Last one was 2013 and was normal.   Past Medical History:  Diagnosis Date  . Adenomatous colon polyp   . Arthritis   . Diabetes mellitus    type II controlled with diet  . GERD (gastroesophageal reflux disease)   . HPV (human papilloma virus) anogenital infection   . Hypertension     Past Surgical History:  Procedure Laterality Date  . CATARACT EXTRACTION    . COLONOSCOPY    . VAGINAL HYSTERECTOMY  1977     Current Outpatient Prescriptions  Medication Sig Dispense Refill  . aspirin 81 MG tablet Take 81 mg by mouth daily.      . Biotin 5000 MCG TABS Take 1 tablet by mouth daily.      Marland Kitchen BYSTOLIC 10 MG tablet Take 5 mg by mouth Daily.    . calcium citrate (CALCITRATE - DOSED IN MG ELEMENTAL CALCIUM) 950 MG tablet Take 1 tablet by mouth daily.      Marland Kitchen CINNAMON PO Take 1,000 mg by mouth daily.      Marland Kitchen losartan-hydrochlorothiazide (HYZAAR) 100-25 MG per tablet Take 1 tablet by mouth Daily.    . Multiple Vitamins-Minerals (MULTI FOR HER 50+  PO) Take 1 tablet by mouth daily.      . naproxen (NAPROSYN) 500 MG tablet Take 1 tablet by mouth as directed.  3  . Potassium (POTASSIMIN PO) Take 1 tablet by mouth as directed.    . ranitidine (ZANTAC) 150 MG tablet Take 1 tablet by mouth Twice daily.     No current facility-administered medications for this visit.     Allergies:   Doxycycline    ROS:  Please see the history of present illness.   Otherwise, review of systems are positive for snoring, mild lower extremity edema.   All other systems are reviewed and negative.    PHYSICAL EXAM: VS:  BP 126/78   Pulse 66   Ht 5\' 6"  (1.676 m)   Wt 245 lb (111.1 kg)   BMI 39.54 kg/m  , BMI Body mass index is 39.54 kg/m. GENERAL:  Well appearing NECK:  No jugular venous distention, waveform within normal limits, carotid upstroke brisk and symmetric, no bruits, no thyromegaly LUNGS:  Clear to auscultation bilaterally BACK:  No CVA tenderness CHEST:  Unremarkable HEART:  PMI not displaced or sustained,S1 and S2 within normal limits, no S3, no S4, no clicks, no rubs, no murmurs ABD:  Flat,  positive bowel sounds normal in frequency in pitch, no bruits, no rebound, no guarding, no midline pulsatile mass, no hepatomegaly, no splenomegaly EXT:  2 plus pulses throughout, mild bilateral edema, no cyanosis no clubbing    EKG:  EKG is not ordered today.   Recent Labs: No results found for requested labs within last 8760 hours.    Lipid Panel No results found for: CHOL, TRIG, HDL, CHOLHDL, VLDL, LDLCALC, LDLDIRECT    Wt Readings from Last 3 Encounters:  07/09/16 245 lb (111.1 kg)  05/13/16 244 lb 12.8 oz (111 kg)  12/12/12 238 lb (108 kg)      Other studies Reviewed: Additional studies/ records that were reviewed today include: Event strips Review of the above records demonstrates:     ASSESSMENT AND PLAN:  Palpitations:  The patient's improved. She had some PVCs and PACs. No change in therapy is indicated.  HTN:  Her blood  pressures well controlled and she'll continue the meds as listed.  WEIGHT:  We have discussed specific strategies for this.  I gave her specific suggestions on exercise.   Current medicines are reviewed at length with the patient today.  The patient does not have concerns regarding medicines.  The following changes have been made:  no change  Labs/ tests ordered today include:   No orders of the defined types were placed in this encounter.    Disposition:   FU with me as needed.     Signed, Minus Breeding, MD  07/09/2016 1:18 PM    [place]

## 2016-07-09 ENCOUNTER — Ambulatory Visit (INDEPENDENT_AMBULATORY_CARE_PROVIDER_SITE_OTHER): Payer: Medicare Other | Admitting: Cardiology

## 2016-07-09 ENCOUNTER — Encounter: Payer: Self-pay | Admitting: Cardiology

## 2016-07-09 VITALS — BP 126/78 | HR 66 | Ht 66.0 in | Wt 245.0 lb

## 2016-07-09 DIAGNOSIS — R002 Palpitations: Secondary | ICD-10-CM | POA: Diagnosis not present

## 2016-07-09 NOTE — Patient Instructions (Signed)
Your physician recommends that you schedule a follow-up appointment in: As Needed    

## 2016-07-30 ENCOUNTER — Encounter: Payer: Self-pay | Admitting: Internal Medicine

## 2016-12-03 ENCOUNTER — Other Ambulatory Visit: Payer: Self-pay | Admitting: Obstetrics and Gynecology

## 2016-12-03 ENCOUNTER — Other Ambulatory Visit (HOSPITAL_COMMUNITY)
Admission: RE | Admit: 2016-12-03 | Discharge: 2016-12-03 | Disposition: A | Discharge status: Home / Self Care | Payer: Medicare Other | Source: Ambulatory Visit | Attending: Obstetrics and Gynecology | Admitting: Obstetrics and Gynecology

## 2016-12-03 DIAGNOSIS — Z124 Encounter for screening for malignant neoplasm of cervix: Secondary | ICD-10-CM | POA: Diagnosis present

## 2016-12-07 LAB — CYTOLOGY - PAP: DIAGNOSIS: NEGATIVE

## 2018-01-02 ENCOUNTER — Other Ambulatory Visit: Payer: Self-pay | Admitting: Obstetrics and Gynecology

## 2018-01-02 ENCOUNTER — Other Ambulatory Visit (HOSPITAL_COMMUNITY)
Admission: RE | Admit: 2018-01-02 | Discharge: 2018-01-02 | Disposition: A | Discharge status: Home / Self Care | Payer: Medicare Other | Source: Ambulatory Visit | Attending: Obstetrics and Gynecology | Admitting: Obstetrics and Gynecology

## 2018-01-02 DIAGNOSIS — B977 Papillomavirus as the cause of diseases classified elsewhere: Secondary | ICD-10-CM | POA: Insufficient documentation

## 2018-01-04 LAB — CYTOLOGY - PAP
Diagnosis: NEGATIVE
HPV (WINDOPATH): NOT DETECTED

## 2018-04-12 ENCOUNTER — Telehealth: Payer: Self-pay | Admitting: *Deleted

## 2018-04-12 NOTE — Telephone Encounter (Signed)
   [facility] Pre-operative Risk Assessment    Request for surgical clearance:  1. What type of surgery is being performed? RIGHT: TKA-MEDIAL AND LATERAL W/WO PATELLA  RESURFACING  2. When is this surgery scheduled? 04/25/18  3. What type of clearance is required (medical clearance vs. Pharmacy clearance to hold med vs. Both)? MEDICAL  4. Are there any medications that need to be held prior to surgery and how long? NA  5. Practice name and name of physician performing surgery? [facility]  6. What is your office phone number [phone]   7.   What is your office fax number [phone]  8.   Anesthesia type (None, local, MAC, general) ? AWAITING CALL BACK WITH TYPE   Devra Dopp 04/12/2018, 10:06 AM  _________________________________________________________________   (provider comments below)

## 2018-04-12 NOTE — Telephone Encounter (Signed)
lmtcb to schedule OV for clearance

## 2018-04-12 NOTE — Telephone Encounter (Signed)
   Primary Cardiologist:James Hochrein, MD (Last seen 2017)  Chart reviewed as part of pre-operative protocol coverage. Because of Odis Wickey Mcdaris's past medical history and time since last visit, he/she will require a follow-up visit in order to better assess preoperative cardiovascular risk.  Pre-op covering staff: - Please schedule appointment and call patient to inform them. - Please contact requesting surgeon's office via preferred method (i.e, phone, fax) to inform them of need for appointment prior to surgery.  [place], [place]  04/12/2018, 2:10 PM

## 2018-04-13 NOTE — H&P (Signed)
TOTAL KNEE ADMISSION H&P  Patient is being admitted for right total knee arthroplasty.  Subjective:  Chief Complaint:    Right knee primary OA / pain  HPI: Tammy [name], 77 y.o. female, has a history of pain and functional disability in the right knee due to arthritis and has failed non-surgical conservative treatments for greater than 12 weeks to includeNSAID's and/or analgesics, corticosteriod injections, use of assistive devices and activity modification.  Onset of symptoms was gradual, starting 2+ years ago with gradually worsening course since that time. The patient noted no past surgery on the right knee(s).  Patient currently rates pain in the right knee(s) at 8 out of 10 with activity. Patient has night pain, worsening of pain with activity and weight bearing, pain that interferes with activities of daily living, pain with passive range of motion, crepitus and joint swelling.  Patient has evidence of periarticular osteophytes and joint space narrowing by imaging studies. There is no active infection.  Risks, benefits and expectations were discussed with the patient.  Risks including but not limited to the risk of anesthesia, blood clots, nerve damage, blood vessel damage, failure of the prosthesis, infection and up to and including death.  Patient understand the risks, benefits and expectations and wishes to proceed with surgery.   PCP: Burnard Bunting, MD  D/C Plans:       Home   Post-op Meds:       No Rx given   Tranexamic Acid:      To be given -  Topically (hx of DVT)  Decadron:      s to be given  FYI:     Lovenox then ASA (doesn't want to use Xarelto)  Oxycodone (already uses Norco)  DME:   Rx given for - RW   PT:   OPPT Rx given   Patient Active Problem List   Diagnosis Date Noted  . Palpitations 05/13/2016   Past Medical History:  Diagnosis Date  . Adenomatous colon polyp   . Arthritis   . Diabetes mellitus    type II controlled with diet  . GERD  (gastroesophageal reflux disease)   . HPV (human papilloma virus) anogenital infection   . Hypertension     Past Surgical History:  Procedure Laterality Date  . CATARACT EXTRACTION    . COLONOSCOPY    . VAGINAL HYSTERECTOMY  1977    No current facility-administered medications for this encounter.    Current Outpatient Medications  Medication Sig Dispense Refill Last Dose  . aspirin 81 MG tablet Take 81 mg by mouth daily.     Taking  . Biotin 5000 MCG TABS Take 1 tablet by mouth daily.     Taking  . BYSTOLIC 10 MG tablet Take 5 mg by mouth Daily.   Taking  . calcium citrate (CALCITRATE - DOSED IN MG ELEMENTAL CALCIUM) 950 MG tablet Take 1 tablet by mouth daily.     Taking  . CINNAMON PO Take 1,000 mg by mouth daily.     Taking  . losartan-hydrochlorothiazide (HYZAAR) 100-25 MG per tablet Take 1 tablet by mouth Daily.   Taking  . Multiple Vitamins-Minerals (MULTI FOR HER 50+ PO) Take 1 tablet by mouth daily.     Taking  . naproxen (NAPROSYN) 500 MG tablet Take 1 tablet by mouth as directed.  3 Taking  . Potassium (POTASSIMIN PO) Take 1 tablet by mouth as directed.   Taking  . ranitidine (ZANTAC) 150 MG tablet Take 1 tablet by mouth  Twice daily.   Taking   Allergies  Allergen Reactions  . Doxycycline Rash    Social History   Tobacco Use  . Smoking status: Former Smoker    Packs/day: 0.33    Years: 22.00    Pack years: 7.26    Types: Cigarettes  . Smokeless tobacco: Never Used  Substance Use Topics  . Alcohol use: No    Alcohol/week: 0.0 oz    Family History  Problem Relation Age of Onset  . Colon polyps Maternal Uncle   . Cancer Mother        breast, died age 78  . Heart attack Mother 89  . Stroke Maternal Grandmother 69  . Heart attack Maternal Grandfather 55  . Cancer Brother   . Heart attack Brother 65     Review of Systems  Constitutional: Negative.   HENT: Negative.   Eyes: Negative.   Respiratory: Negative.   Cardiovascular: Negative.   Gastrointestinal:  Positive for constipation and heartburn.  Genitourinary: Negative.   Musculoskeletal: Positive for joint pain.  Skin: Negative.   Neurological: Negative.   Endo/Heme/Allergies: Negative.   Psychiatric/Behavioral: Negative.     Objective:  Physical Exam  Constitutional: She is oriented to person, place, and time. She appears well-developed.  HENT:  Head: Normocephalic.  Eyes: Pupils are equal, round, and reactive to light.  Neck: Neck supple. No JVD present. No tracheal deviation present. No thyromegaly present.  Cardiovascular: Normal rate, regular rhythm and intact distal pulses.  Respiratory: Effort normal and breath sounds normal. No respiratory distress. She has no wheezes.  GI: Soft. There is no tenderness. There is no guarding.  Musculoskeletal:       Right knee: She exhibits decreased range of motion, swelling and bony tenderness. She exhibits no ecchymosis, no deformity, no laceration and no erythema. Tenderness found.  Lymphadenopathy:    She has no cervical adenopathy.  Neurological: She is alert and oriented to person, place, and time. A sensory deficit (bilateral LE neuropathy) is present.  Skin: Skin is warm and dry.  Psychiatric: She has a normal mood and affect.      Labs:  Estimated body mass index is 39.54 kg/m as calculated from the following:   Height as of 07/09/16: 5\' 6"  (1.676 m).   Weight as of 07/09/16: 111.1 kg (245 lb).   Imaging Review Plain radiographs demonstrate severe degenerative joint disease of the right knee(s). The bone quality appears to be good for age and reported activity level.   Preoperative templating of the joint replacement has been completed, documented, and submitted to the Operating Room personnel in order to optimize intra-operative equipment management.   Anticipated LOS equal to or greater than 2 midnights due to - Age 75 and older with one or more of the following:  - Obesity  - Expected need for hospital services (PT,  OT, Nursing) required for safe  discharge  - Anticipated need for postoperative skilled nursing care or inpatient rehab  - Active co-morbidities: DVT/VTE    Assessment/Plan:  End stage arthritis, right knee   The patient history, physical examination, clinical judgment of the provider and imaging studies are consistent with end stage degenerative joint disease of the right knee(s) and total knee arthroplasty is deemed medically necessary. The treatment options including medical management, injection therapy arthroscopy and arthroplasty were discussed at length. The risks and benefits of total knee arthroplasty were presented and reviewed. The risks due to aseptic loosening, infection, stiffness, patella tracking problems, thromboembolic complications and  other imponderables were discussed. The patient acknowledged the explanation, agreed to proceed with the plan and consent was signed. Patient is being admitted for inpatient treatment for surgery, pain control, PT, OT, prophylactic antibiotics, VTE prophylaxis, progressive ambulation and ADL's and discharge planning. The patient is planning to be discharged home.     [place] Shinika Estelle   PA-C  04/13/2018, 10:18 AM

## 2018-04-14 ENCOUNTER — Telehealth: Payer: Self-pay | Admitting: Cardiology

## 2018-04-14 NOTE — Telephone Encounter (Signed)
2nd attempt to reach pt re: preoperative clearance. Pt needs to be seen. Left a detailed message for pt that she needed to be seen and to call us back to make that appt.

## 2018-04-17 NOTE — Telephone Encounter (Signed)
Pt has been scheduled to see Dr. Percival Spanish 04/24/18 for pre op clearance.

## 2018-04-18 NOTE — Patient Instructions (Addendum)
Tammy [name]  04/18/2018   Your procedure is scheduled on: 04-25-18   Report to [facility] Main  Entrance    Report to admitting at 11:45 AM    Call this number if you have problems the morning of surgery [phone]   Remember: Do not eat food or drink liquids :After Midnight. You may have a Clear Liquid Diet from Midnight until 8:15 AM. After 8:15 AM, nothing until after surgery.     CLEAR LIQUID DIET   Foods Allowed                                                                     Foods Excluded  Coffee and tea, regular and decaf                             liquids that you cannot  Plain Jell-O in any flavor                                             see through such as: Fruit ices (not with fruit pulp)                                     milk, soups, orange juice  Iced Popsicles                                    All solid food Carbonated beverages, regular and diet                                    Cranberry, grape and apple juices Sports drinks like Gatorade Lightly seasoned clear broth or consume(fat free) Sugar, honey syrup  Sample Menu Breakfast                                Lunch                                     Supper Cranberry juice                    Beef broth                            Chicken broth Jell-O                                     Grape juice  Apple juice Coffee or tea                        Jell-O                                      Popsicle                                                Coffee or tea                        Coffee or tea  _____________________________________________________________________     Take these medicines the morning of surgery with A SIP OF WATER: Bystolic (Nebivolol) and Ranitidine (Zantac)                                 You may not have any metal on your body including hair pins and              piercings  Do not wear jewelry, make-up, lotions, powders or  perfumes, deodorant             Men may shave face and neck.   Do not bring valuables to the hospital. [place].  Contacts, dentures or bridgework may not be worn into surgery.  Leave suitcase in the car. After surgery it may be brought to your room.      Special Instructions: N/A              Please read over the following fact sheets you were given: _____________________________________________________________________             [facility] - Preparing for Surgery Before surgery, you can play an important role.  Because skin is not sterile, your skin needs to be as free of germs as possible.  You can reduce the number of germs on your skin by washing with CHG (chlorahexidine gluconate) soap before surgery.  CHG is an antiseptic cleaner which kills germs and bonds with the skin to continue killing germs even after washing. Please DO NOT use if you have an allergy to CHG or antibacterial soaps.  If your skin becomes reddened/irritated stop using the CHG and inform your nurse when you arrive at Short Stay. Do not shave (including legs and underarms) for at least 48 hours prior to the first CHG shower.  You may shave your face/neck. Please follow these instructions carefully:  1.  Shower with CHG Soap the night before surgery and the  morning of Surgery.  2.  If you choose to wash your hair, wash your hair first as usual with your  normal  shampoo.  3.  After you shampoo, rinse your hair and body thoroughly to remove the  shampoo.                           4.  Use CHG as you would any other liquid soap.  You can apply chg directly  to the skin and wash  Gently with a scrungie or clean washcloth.  5.  Apply the CHG Soap to your body ONLY FROM THE NECK DOWN.   Do not use on face/ open                           Wound or open sores. Avoid contact with eyes, ears mouth and genitals (private parts).                       Wash  face,  Genitals (private parts) with your normal soap.             6.  Wash thoroughly, paying special attention to the area where your surgery  will be performed.  7.  Thoroughly rinse your body with warm water from the neck down.  8.  DO NOT shower/wash with your normal soap after using and rinsing off  the CHG Soap.                9.  Pat yourself dry with a clean towel.            10.  Wear clean pajamas.            11.  Place clean sheets on your bed the night of your first shower and do not  sleep with pets. Day of Surgery : Do not apply any lotions/deodorants the morning of surgery.  Please wear clean clothes to the hospital/surgery center.  FAILURE TO FOLLOW THESE INSTRUCTIONS MAY RESULT IN THE CANCELLATION OF YOUR SURGERY PATIENT SIGNATURE_________________________________  NURSE SIGNATURE__________________________________  ________________________________________________________________________   Tammy [name]  An incentive spirometer is a tool that can help keep your lungs clear and active. This tool measures how well you are filling your lungs with each breath. Taking long deep breaths may help reverse or decrease the chance of developing breathing (pulmonary) problems (especially infection) following:  A long period of time when you are unable to move or be active. BEFORE THE PROCEDURE   If the spirometer includes an indicator to show your best effort, your nurse or respiratory therapist will set it to a desired goal.  If possible, sit up straight or lean slightly forward. Try not to slouch.  Hold the incentive spirometer in an upright position. INSTRUCTIONS FOR USE  1. Sit on the edge of your bed if possible, or sit up as far as you can in bed or on a chair. 2. Hold the incentive spirometer in an upright position. 3. Breathe out normally. 4. Place the mouthpiece in your mouth and seal your lips tightly around it. 5. Breathe in slowly and as deeply as possible,  raising the piston or the ball toward the top of the column. 6. Hold your breath for 3-5 seconds or for as long as possible. Allow the piston or ball to fall to the bottom of the column. 7. Remove the mouthpiece from your mouth and breathe out normally. 8. Rest for a few seconds and repeat Steps 1 through 7 at least 10 times every 1-2 hours when you are awake. Take your time and take a few normal breaths between deep breaths. 9. The spirometer may include an indicator to show your best effort. Use the indicator as a goal to work toward during each repetition. 10. After each set of 10 deep breaths, practice coughing to be sure your lungs are clear. If you have an incision (the cut made at the time of  surgery), support your incision when coughing by placing a pillow or rolled up towels firmly against it. Once you are able to get out of bed, walk around indoors and cough well. You may stop using the incentive spirometer when instructed by your caregiver.  RISKS AND COMPLICATIONS  Take your time so you do not get dizzy or light-headed.  If you are in pain, you may need to take or ask for pain medication before doing incentive spirometry. It is harder to take a deep breath if you are having pain. AFTER USE  Rest and breathe slowly and easily.  It can be helpful to keep track of a log of your progress. Your caregiver can provide you with a simple table to help with this. If you are using the spirometer at home, follow these instructions: [place] IF:   You are having difficultly using the spirometer.  You have trouble using the spirometer as often as instructed.  Your pain medication is not giving enough relief while using the spirometer.  You develop fever of 100.5 F (38.1 C) or higher. SEEK IMMEDIATE MEDICAL CARE IF:   You cough up bloody sputum that had not been present before.  You develop fever of 102 F (38.9 C) or greater.  You develop worsening pain at or near the  incision site. MAKE SURE YOU:   Understand these instructions.  Will watch your condition.  Will get help right away if you are not doing well or get worse. Document Released: 04/11/2007 Document Revised: 02/21/2012 Document Reviewed: 06/12/2007 Exit[employer] Patient Information 2014 Exit[employer], [place].   ________________________________________________________________________  WHAT IS A BLOOD TRANSFUSION? Blood Transfusion Information  A transfusion is the replacement of blood or some of its parts. Blood is made up of multiple cells which provide different functions.  Red blood cells carry oxygen and are used for blood loss replacement.  White blood cells fight against infection.  Platelets control bleeding.  Plasma helps clot blood.  Other blood products are available for specialized needs, such as hemophilia or other clotting disorders. BEFORE THE TRANSFUSION  Who gives blood for transfusions?   Healthy volunteers who are fully evaluated to make sure their blood is safe. This is blood bank blood. Transfusion therapy is the safest it has ever been in the practice of medicine. Before blood is taken from a donor, a complete history is taken to make sure that person has no history of diseases nor engages in risky social behavior (examples are intravenous drug use or sexual activity with multiple partners). The donor's travel history is screened to minimize risk of transmitting infections, such as malaria. The donated blood is tested for signs of infectious diseases, such as HIV and hepatitis. The blood is then tested to be sure it is compatible with you in order to minimize the chance of a transfusion reaction. If you or a relative donates blood, this is often done in anticipation of surgery and is not appropriate for emergency situations. It takes many days to process the donated blood. RISKS AND COMPLICATIONS Although transfusion therapy is very safe and saves many lives, the main dangers  of transfusion include:   Getting an infectious disease.  Developing a transfusion reaction. This is an allergic reaction to something in the blood you were given. Every precaution is taken to prevent this. The decision to have a blood transfusion has been considered carefully by your caregiver before blood is given. Blood is not given unless the benefits outweigh the risks. AFTER THE  TRANSFUSION  Right after receiving a blood transfusion, you will usually feel much better and more energetic. This is especially true if your red blood cells have gotten low (anemic). The transfusion raises the level of the red blood cells which carry oxygen, and this usually causes an energy increase.  The nurse administering the transfusion will monitor you carefully for complications. HOME CARE INSTRUCTIONS  No special instructions are needed after a transfusion. You may find your energy is better. Speak with your caregiver about any limitations on activity for underlying diseases you may have. SEEK MEDICAL CARE IF:   Your condition is not improving after your transfusion.  You develop redness or irritation at the intravenous (IV) site. SEEK IMMEDIATE MEDICAL CARE IF:  Any of the following symptoms occur over the next 12 hours:  Shaking chills.  You have a temperature by mouth above 102 F (38.9 C), not controlled by medicine.  Chest, back, or muscle pain.  People around you feel you are not acting correctly or are confused.  Shortness of breath or difficulty breathing.  Dizziness and fainting.  You get a rash or develop hives.  You have a decrease in urine output.  Your urine turns a dark color or changes to pink, red, or brown. Any of the following symptoms occur over the next 10 days:  You have a temperature by mouth above 102 F (38.9 C), not controlled by medicine.  Shortness of breath.  Weakness after normal activity.  The white part of the eye turns yellow (jaundice).  You  have a decrease in the amount of urine or are urinating less often.  Your urine turns a dark color or changes to pink, red, or brown. Document Released: 11/26/2000 Document Revised: 02/21/2012 Document Reviewed: 07/15/2008 [facility] Patient Information 2014 [place], [place].  _______________________________________________________________________

## 2018-04-19 ENCOUNTER — Encounter (HOSPITAL_COMMUNITY): Payer: Self-pay

## 2018-04-19 ENCOUNTER — Other Ambulatory Visit: Payer: Self-pay

## 2018-04-19 ENCOUNTER — Encounter (HOSPITAL_COMMUNITY)
Admission: RE | Admit: 2018-04-19 | Discharge: 2018-04-19 | Disposition: A | Discharge status: Home / Self Care | Payer: Medicare Other | Source: Ambulatory Visit | Attending: Orthopedic Surgery | Admitting: Orthopedic Surgery

## 2018-04-19 DIAGNOSIS — R9431 Abnormal electrocardiogram [ECG] [EKG]: Secondary | ICD-10-CM | POA: Insufficient documentation

## 2018-04-19 DIAGNOSIS — Z01810 Encounter for preprocedural cardiovascular examination: Secondary | ICD-10-CM | POA: Insufficient documentation

## 2018-04-19 DIAGNOSIS — I10 Essential (primary) hypertension: Secondary | ICD-10-CM | POA: Insufficient documentation

## 2018-04-19 DIAGNOSIS — M1711 Unilateral primary osteoarthritis, right knee: Secondary | ICD-10-CM | POA: Insufficient documentation

## 2018-04-19 DIAGNOSIS — M25561 Pain in right knee: Secondary | ICD-10-CM | POA: Diagnosis not present

## 2018-04-19 DIAGNOSIS — Z01812 Encounter for preprocedural laboratory examination: Secondary | ICD-10-CM | POA: Diagnosis present

## 2018-04-19 LAB — BASIC METABOLIC PANEL
Anion gap: 11 (ref 5–15)
BUN: 33 mg/dL — AB (ref 6–20)
CO2: 25 mmol/L (ref 22–32)
Calcium: 9.4 mg/dL (ref 8.9–10.3)
Chloride: 107 mmol/L (ref 101–111)
Creatinine, Ser: 1.75 mg/dL — ABNORMAL HIGH (ref 0.44–1.00)
GFR calc Af Amer: 31 mL/min — ABNORMAL LOW (ref >60)
GFR calc non Af Amer: 27 mL/min — ABNORMAL LOW (ref >60)
GLUCOSE: 128 mg/dL — AB (ref 65–99)
Potassium: 4.7 mmol/L (ref 3.5–5.1)
SODIUM: 143 mmol/L (ref 135–145)

## 2018-04-19 LAB — SURGICAL PCR SCREEN
MRSA, PCR: NEGATIVE
Staphylococcus aureus: POSITIVE — AB

## 2018-04-19 LAB — ABO/RH: ABO/RH(D): A POS

## 2018-04-20 NOTE — Progress Notes (Signed)
04-20-18 PCR result routed to Dr. Alvan Dame for review.

## 2018-04-23 NOTE — Progress Notes (Signed)
Cardiology Office Note   Date:  04/24/2018   ID:  Tammy [name], DOB 1941-02-11, MRN [id number]  PCP:  Burnard Bunting, MD  Cardiologist:   Minus Breeding, MD   Chief Complaint  Patient presents with  . Pre-op Exam      History of Present Illness: Tammy [name] is a 77 y.o. female who presents for evaluation of palpitations.  She's had no past cardiac history. She's had a couple of stress tests. Last one was 2013 and was normal.  She had  an event monitor.   She had rare palpitations with PACs and PVCs .     She is here for preop clearance.     She is to have total knee replacement tomorrow.  She has not had any new cardiac complaints.  She can ambulate 50 yards to her mailbox on level ground with a cane and does not get chest pressure, neck or arm discomfort.  She does not notice palpitations, presyncope or syncope.  She has no PND or orthopnea.  She is limited by chronic dyspnea walking up an incline or stairs but part of this is weight and deconditioning.  She is had no new symptoms since his stress perfusion study in 2013.    Past Medical History:  Diagnosis Date  . Adenomatous colon polyp   . Arthritis   . Diabetes mellitus    type II controlled with diet  . GERD (gastroesophageal reflux disease)   . HPV (human papilloma virus) anogenital infection   . Hypertension     Past Surgical History:  Procedure Laterality Date  . CATARACT EXTRACTION    . COLONOSCOPY    . VAGINAL HYSTERECTOMY  1977     Current Outpatient Medications  Medication Sig Dispense Refill  . ALPRAZolam (XANAX) 0.25 MG tablet Take 0.125 mg by mouth daily as needed for anxiety.  1  . aspirin EC 81 MG tablet Take 81 mg by mouth at bedtime.    . Biotin 10000 MCG TABS Take 10,000 mcg by mouth daily.    Marland Kitchen BYSTOLIC 5 MG tablet Take 5 mg by mouth daily.  5  . CINNAMON PO Take 1,000 mg by mouth 2 (two) times daily.     . diclofenac sodium (VOLTAREN) 1 % GEL Apply 4 g topically 4 (four) times daily as  needed for pain.  3  . HYDROcodone-acetaminophen (NORCO/VICODIN) 5-325 MG tablet Take 1 tablet by mouth daily as needed (for pain.).   0  . losartan-hydrochlorothiazide (HYZAAR) 100-25 MG per tablet Take 1 tablet by mouth Daily.    . Magnesium Oxide (MAG-200 PO) Take 200 mg by mouth daily. MAGNESIUM MALATE 1300 MG    . Multiple Vitamin (MULTIVITAMIN WITH MINERALS) TABS tablet Take 1 tablet by mouth daily. CENTRUM SILVER    . naproxen (NAPROSYN) 500 MG tablet Take 500 mg by mouth 2 (two) times daily.   3  . Potassium 99 MG TABS Take 99 mg by mouth daily.    . ranitidine (ZANTAC) 150 MG tablet Take 150 mg by mouth 2 (two) times daily.      No current facility-administered medications for this visit.     Allergies:   Adhesive [tape] and Doxycycline    ROS:  Please see the history of present illness.   Otherwise, review of systems are positive for none.   All other systems are reviewed and negative.    PHYSICAL EXAM: VS:  BP (!) 156/62   Pulse 68   Ht  5\' 7"  (1.702 m)   Wt 242 lb 6.4 oz (110 kg)   SpO2 95%   BMI 37.97 kg/m  , BMI Body mass index is 37.97 kg/m.  GENERAL:  Well appearing NECK:  No jugular venous distention, waveform within normal limits, carotid upstroke brisk and symmetric, no bruits, no thyromegaly LUNGS:  Clear to auscultation bilaterally CHEST:  Unremarkable HEART:  PMI not displaced or sustained,S1 and S2 within normal limits, no S3, no S4, no clicks, no rubs, no murmurs ABD:  Flat, positive bowel sounds normal in frequency in pitch, no bruits, no rebound, no guarding, no midline pulsatile mass, no hepatomegaly, no splenomegaly EXT:  2 plus pulses upper and 2 plus PT bilateral , mild ankle edema, no cyanosis no clubbing   EKG:  EKG is not ordered today. Sinus rhythm, rate 73, premature atrial contractions, poor anterior R wave progression, no acute ST-T wave changes.  No change from June 2017.  Recent Labs: 04/19/2018: BUN 33; Creatinine, Ser 1.75; Potassium 4.7;  Sodium 143    Lipid Panel No results found for: CHOL, TRIG, HDL, CHOLHDL, VLDL, LDLCALC, LDLDIRECT    Wt Readings from Last 3 Encounters:  04/24/18 242 lb 6.4 oz (110 kg)  04/19/18 246 lb 6 oz (111.8 kg)  07/09/16 245 lb (111.1 kg)      Other studies Reviewed: Additional studies/ records that were reviewed today include:  Labs, old EKG Review of the above records demonstrates:     ASSESSMENT AND PLAN:   PALPITATIONS:    She does not really notice her PACs.  No change in therapy is indicated.    HTN:    Her blood pressure is elevated today but she was told to hold her losartan today.  She should keep a blood pressure diary and further adjustments based on readings at home.    WEIGHT:     She understands the need to lose weight with diet and exercise.  We have discussed this previously.  PREOP: The patient has a moderate functional level.  She is not going for high risk procedure.  She has no high risk findings.  According to the modified Truman Hayward criteria she is a low risk with a less than 0.9% chance of major cardiovascular complications.  I do note her PACs and suspect that she would be at higher risk for atrial arrhythmias and we would be happy to be involved.  However, according to further guidelines no further preoperative testing is indicated in this situation.   Current medicines are reviewed at length with the patient today.  The patient does not have concerns regarding medicines.  The following changes have been made: none  Labs/ tests ordered today include:   None  No orders of the defined types were placed in this encounter.    Disposition:   FU with me as needed    Signed, Minus Breeding, MD  04/24/2018 10:45 AM    [place]

## 2018-04-24 ENCOUNTER — Ambulatory Visit: Payer: Medicare Other | Admitting: Cardiology

## 2018-04-24 ENCOUNTER — Encounter: Payer: Self-pay | Admitting: Cardiology

## 2018-04-24 VITALS — BP 156/62 | HR 68 | Ht 67.0 in | Wt 242.4 lb

## 2018-04-24 DIAGNOSIS — I491 Atrial premature depolarization: Secondary | ICD-10-CM

## 2018-04-24 DIAGNOSIS — Z01810 Encounter for preprocedural cardiovascular examination: Secondary | ICD-10-CM | POA: Diagnosis not present

## 2018-04-24 DIAGNOSIS — I10 Essential (primary) hypertension: Secondary | ICD-10-CM | POA: Diagnosis not present

## 2018-04-24 MED ORDER — TRANEXAMIC ACID 1000 MG/10ML IV SOLN
2000.0000 mg | Freq: Once | INTRAVENOUS | Status: DC
  Filled 2018-04-24: qty 20

## 2018-04-24 NOTE — Progress Notes (Signed)
04-24-18 (Epic) Cardiac Clearance on chart.

## 2018-04-24 NOTE — Patient Instructions (Signed)
Medication Instructions:  Continue current medications  If you need a refill on your cardiac medications before your next appointment, please call your pharmacy.  Labwork: None Ordered  Testing/Procedures: None Ordered  Follow-Up: Your physician wants you to follow-up in: As Needed.      Thank you for choosing [facility]!!       

## 2018-04-25 ENCOUNTER — Observation Stay (HOSPITAL_COMMUNITY)
Admission: RE | Admit: 2018-04-25 | Discharge: 2018-04-27 | Disposition: A | Discharge status: Home / Self Care | Payer: Medicare Other | Source: Ambulatory Visit | Attending: Orthopedic Surgery | Admitting: Orthopedic Surgery

## 2018-04-25 ENCOUNTER — Encounter (HOSPITAL_COMMUNITY): Payer: Self-pay

## 2018-04-25 ENCOUNTER — Ambulatory Visit (HOSPITAL_COMMUNITY): Payer: Medicare Other | Admitting: Anesthesiology

## 2018-04-25 ENCOUNTER — Other Ambulatory Visit: Payer: Self-pay

## 2018-04-25 ENCOUNTER — Encounter (HOSPITAL_COMMUNITY)
Admission: RE | Disposition: A | Discharge status: Home / Self Care | Payer: Self-pay | Source: Ambulatory Visit | Attending: Orthopedic Surgery

## 2018-04-25 DIAGNOSIS — Z881 Allergy status to other antibiotic agents status: Secondary | ICD-10-CM | POA: Diagnosis not present

## 2018-04-25 DIAGNOSIS — E669 Obesity, unspecified: Secondary | ICD-10-CM | POA: Diagnosis present

## 2018-04-25 DIAGNOSIS — I10 Essential (primary) hypertension: Secondary | ICD-10-CM | POA: Diagnosis not present

## 2018-04-25 DIAGNOSIS — Z79899 Other long term (current) drug therapy: Secondary | ICD-10-CM | POA: Insufficient documentation

## 2018-04-25 DIAGNOSIS — M1711 Unilateral primary osteoarthritis, right knee: Principal | ICD-10-CM | POA: Insufficient documentation

## 2018-04-25 DIAGNOSIS — K219 Gastro-esophageal reflux disease without esophagitis: Secondary | ICD-10-CM | POA: Diagnosis not present

## 2018-04-25 DIAGNOSIS — E119 Type 2 diabetes mellitus without complications: Secondary | ICD-10-CM | POA: Diagnosis not present

## 2018-04-25 DIAGNOSIS — Z7982 Long term (current) use of aspirin: Secondary | ICD-10-CM | POA: Diagnosis not present

## 2018-04-25 DIAGNOSIS — Z96651 Presence of right artificial knee joint: Secondary | ICD-10-CM

## 2018-04-25 DIAGNOSIS — Z96659 Presence of unspecified artificial knee joint: Secondary | ICD-10-CM

## 2018-04-25 DIAGNOSIS — Z6839 Body mass index (BMI) 39.0-39.9, adult: Secondary | ICD-10-CM | POA: Diagnosis not present

## 2018-04-25 HISTORY — PX: TOTAL KNEE ARTHROPLASTY: SHX125

## 2018-04-25 LAB — CBC
HEMATOCRIT: 37.9 % (ref 36.0–46.0)
HEMOGLOBIN: 12.1 g/dL (ref 12.0–15.0)
MCH: 29.2 pg (ref 26.0–34.0)
MCHC: 31.9 g/dL (ref 30.0–36.0)
MCV: 91.5 fL (ref 78.0–100.0)
Platelets: 213 10*3/uL (ref 150–400)
RBC: 4.14 MIL/uL (ref 3.87–5.11)
RDW: 13.7 % (ref 11.5–15.5)
WBC: 9.2 10*3/uL (ref 4.0–10.5)

## 2018-04-25 LAB — TYPE AND SCREEN
ABO/RH(D): A POS
ANTIBODY SCREEN: NEGATIVE

## 2018-04-25 LAB — CREATININE, SERUM
Creatinine, Ser: 1.7 mg/dL — ABNORMAL HIGH (ref 0.44–1.00)
GFR calc Af Amer: 32 mL/min — ABNORMAL LOW (ref >60)
GFR calc non Af Amer: 28 mL/min — ABNORMAL LOW (ref >60)

## 2018-04-25 LAB — GLUCOSE, CAPILLARY: Glucose-Capillary: 144 mg/dL — ABNORMAL HIGH (ref 65–99)

## 2018-04-25 SURGERY — ARTHROPLASTY, KNEE, TOTAL
Anesthesia: Monitor Anesthesia Care | Site: Knee | Laterality: Right

## 2018-04-25 MED ORDER — POTASSIUM 99 MG PO TABS: 99.0000 mg | ORAL_TABLET | Freq: Every day | ORAL | Status: DC

## 2018-04-25 MED ORDER — STERILE WATER FOR IRRIGATION IR SOLN
Status: DC | PRN
  Administered 2018-04-25: 2000 mL

## 2018-04-25 MED ORDER — ACETAMINOPHEN 500 MG PO TABS
1000.0000 mg | ORAL_TABLET | Freq: Four times a day (QID) | ORAL | Status: AC
  Administered 2018-04-25 – 2018-04-26 (×4): 1000 mg via ORAL
  Filled 2018-04-25 (×4): qty 2

## 2018-04-25 MED ORDER — LIDOCAINE 2% (20 MG/ML) 5 ML SYRINGE
INTRAMUSCULAR | Status: AC
  Filled 2018-04-25: qty 5

## 2018-04-25 MED ORDER — ENOXAPARIN SODIUM 40 MG/0.4ML ~~LOC~~ SOLN: 40.0000 mg | SUBCUTANEOUS | 0 refills | Status: AC

## 2018-04-25 MED ORDER — METOCLOPRAMIDE HCL 5 MG/ML IJ SOLN: 5.0000 mg | Freq: Three times a day (TID) | INTRAMUSCULAR | Status: DC | PRN

## 2018-04-25 MED ORDER — ENOXAPARIN SODIUM 40 MG/0.4ML ~~LOC~~ SOLN
40.0000 mg | SUBCUTANEOUS | Status: DC
  Administered 2018-04-26 – 2018-04-27 (×2): 40 mg via SUBCUTANEOUS
  Filled 2018-04-25 (×2): qty 0.4

## 2018-04-25 MED ORDER — PROPOFOL 500 MG/50ML IV EMUL
INTRAVENOUS | Status: DC | PRN
  Administered 2018-04-25: 50 ug/kg/min via INTRAVENOUS

## 2018-04-25 MED ORDER — DEXAMETHASONE SODIUM PHOSPHATE 10 MG/ML IJ SOLN: 10.0000 mg | Freq: Once | INTRAMUSCULAR | Status: DC

## 2018-04-25 MED ORDER — VANCOMYCIN HCL 1 G IV SOLR
INTRAVENOUS | Status: DC | PRN
  Administered 2018-04-25: 1000 mg via TOPICAL

## 2018-04-25 MED ORDER — TOBRAMYCIN SULFATE 1.2 G IJ SOLR
INTRAMUSCULAR | Status: AC
  Filled 2018-04-25: qty 1.2

## 2018-04-25 MED ORDER — PHENYLEPHRINE 40 MCG/ML (10ML) SYRINGE FOR IV PUSH (FOR BLOOD PRESSURE SUPPORT)
PREFILLED_SYRINGE | INTRAVENOUS | Status: AC
  Filled 2018-04-25: qty 20

## 2018-04-25 MED ORDER — FENTANYL CITRATE (PF) 100 MCG/2ML IJ SOLN
INTRAMUSCULAR | Status: AC
  Filled 2018-04-25: qty 2

## 2018-04-25 MED ORDER — ONDANSETRON HCL 4 MG/2ML IJ SOLN
INTRAMUSCULAR | Status: AC
  Filled 2018-04-25: qty 2

## 2018-04-25 MED ORDER — BUPIVACAINE IN DEXTROSE 0.75-8.25 % IT SOLN
INTRATHECAL | Status: DC | PRN
  Administered 2018-04-25: 2 mL via INTRATHECAL

## 2018-04-25 MED ORDER — LOSARTAN POTASSIUM-HCTZ 100-25 MG PO TABS: 1.0000 | ORAL_TABLET | Freq: Every day | ORAL | Status: DC

## 2018-04-25 MED ORDER — NEBIVOLOL HCL 5 MG PO TABS
5.0000 mg | ORAL_TABLET | Freq: Every day | ORAL | Status: DC
  Administered 2018-04-26 – 2018-04-27 (×2): 5 mg via ORAL
  Filled 2018-04-25 (×3): qty 1

## 2018-04-25 MED ORDER — METHOCARBAMOL 1000 MG/10ML IJ SOLN
500.0000 mg | Freq: Four times a day (QID) | INTRAMUSCULAR | Status: DC | PRN
  Filled 2018-04-25: qty 5

## 2018-04-25 MED ORDER — PROPOFOL 10 MG/ML IV BOLUS
INTRAVENOUS | Status: AC
  Filled 2018-04-25: qty 20

## 2018-04-25 MED ORDER — BUPIVACAINE HCL (PF) 0.5 % IJ SOLN
INTRAMUSCULAR | Status: DC | PRN
  Administered 2018-04-25: 150 mg via PERINEURAL

## 2018-04-25 MED ORDER — DOCUSATE SODIUM 100 MG PO CAPS: 100.0000 mg | ORAL_CAPSULE | Freq: Two times a day (BID) | ORAL | 0 refills | Status: AC

## 2018-04-25 MED ORDER — BUPIVACAINE HCL (PF) 0.25 % IJ SOLN
INTRAMUSCULAR | Status: AC
  Filled 2018-04-25: qty 30

## 2018-04-25 MED ORDER — LACTATED RINGERS IV SOLN
INTRAVENOUS | Status: DC
Start: 2018-04-25 — End: 2018-04-25
  Administered 2018-04-25 (×2): via INTRAVENOUS

## 2018-04-25 MED ORDER — TRANEXAMIC ACID 1000 MG/10ML IV SOLN: 2000.0000 mg | Freq: Once | INTRAVENOUS | Status: DC

## 2018-04-25 MED ORDER — FENTANYL CITRATE (PF) 100 MCG/2ML IJ SOLN
INTRAMUSCULAR | Status: AC
  Administered 2018-04-25: 50 ug via INTRAVENOUS
  Filled 2018-04-25: qty 2

## 2018-04-25 MED ORDER — SODIUM CHLORIDE 0.9 % IJ SOLN
INTRAMUSCULAR | Status: DC | PRN
  Administered 2018-04-25: 29 mL

## 2018-04-25 MED ORDER — OXYCODONE HCL 5 MG PO TABS
10.0000 mg | ORAL_TABLET | ORAL | Status: DC | PRN
  Administered 2018-04-26 (×2): 10 mg via ORAL
  Filled 2018-04-25 (×3): qty 2

## 2018-04-25 MED ORDER — ACETAMINOPHEN 500 MG PO TABS: 1000.0000 mg | ORAL_TABLET | Freq: Three times a day (TID) | ORAL | 0 refills | Status: AC

## 2018-04-25 MED ORDER — LIDOCAINE 2% (20 MG/ML) 5 ML SYRINGE
INTRAMUSCULAR | Status: DC | PRN
  Administered 2018-04-25: 40 mg via INTRAVENOUS

## 2018-04-25 MED ORDER — HYDROMORPHONE HCL 1 MG/ML IJ SOLN: 0.5000 mg | INTRAMUSCULAR | Status: DC | PRN

## 2018-04-25 MED ORDER — CEFAZOLIN SODIUM-DEXTROSE 2-4 GM/100ML-% IV SOLN
2.0000 g | Freq: Four times a day (QID) | INTRAVENOUS | Status: AC
  Administered 2018-04-25 – 2018-04-26 (×2): 2 g via INTRAVENOUS
  Filled 2018-04-25 (×2): qty 100

## 2018-04-25 MED ORDER — METHOCARBAMOL 500 MG PO TABS
500.0000 mg | ORAL_TABLET | Freq: Four times a day (QID) | ORAL | Status: DC | PRN
  Administered 2018-04-25 – 2018-04-27 (×5): 500 mg via ORAL
  Filled 2018-04-25 (×6): qty 1

## 2018-04-25 MED ORDER — LOSARTAN POTASSIUM 50 MG PO TABS
100.0000 mg | ORAL_TABLET | Freq: Every day | ORAL | Status: DC
  Administered 2018-04-25 – 2018-04-27 (×3): 100 mg via ORAL
  Filled 2018-04-25 (×3): qty 2

## 2018-04-25 MED ORDER — FENTANYL CITRATE (PF) 100 MCG/2ML IJ SOLN
INTRAMUSCULAR | Status: AC
  Administered 2018-04-25: 100 ug via INTRAVENOUS
  Filled 2018-04-25: qty 2

## 2018-04-25 MED ORDER — CEFAZOLIN SODIUM-DEXTROSE 2-4 GM/100ML-% IV SOLN
2.0000 g | INTRAVENOUS | Status: AC
  Administered 2018-04-25: 2 g via INTRAVENOUS
  Filled 2018-04-25: qty 100

## 2018-04-25 MED ORDER — 0.9 % SODIUM CHLORIDE (POUR BTL) OPTIME
TOPICAL | Status: DC | PRN
  Administered 2018-04-25: 1000 mL

## 2018-04-25 MED ORDER — PROPOFOL 10 MG/ML IV BOLUS
INTRAVENOUS | Status: AC
  Filled 2018-04-25: qty 40

## 2018-04-25 MED ORDER — MAGNESIUM CITRATE PO SOLN: 1.0000 | Freq: Once | ORAL | Status: DC | PRN

## 2018-04-25 MED ORDER — KETOROLAC TROMETHAMINE 30 MG/ML IJ SOLN
INTRAMUSCULAR | Status: AC
  Filled 2018-04-25: qty 1

## 2018-04-25 MED ORDER — CELECOXIB 200 MG PO CAPS
200.0000 mg | ORAL_CAPSULE | Freq: Two times a day (BID) | ORAL | Status: DC
  Administered 2018-04-25: 200 mg via ORAL
  Filled 2018-04-25: qty 1

## 2018-04-25 MED ORDER — CHLORHEXIDINE GLUCONATE 4 % EX LIQD: 60.0000 mL | Freq: Once | CUTANEOUS | Status: DC

## 2018-04-25 MED ORDER — PHENOL 1.4 % MT LIQD
1.0000 | OROMUCOSAL | Status: DC | PRN
  Filled 2018-04-25: qty 177

## 2018-04-25 MED ORDER — PROPOFOL 10 MG/ML IV BOLUS
INTRAVENOUS | Status: AC
Start: 2018-04-25 — End: ?
  Filled 2018-04-25: qty 20

## 2018-04-25 MED ORDER — MIDAZOLAM HCL 2 MG/2ML IJ SOLN
1.0000 mg | INTRAMUSCULAR | Status: DC
  Administered 2018-04-25: 2 mg via INTRAVENOUS

## 2018-04-25 MED ORDER — BISACODYL 10 MG RE SUPP: 10.0000 mg | Freq: Every day | RECTAL | Status: DC | PRN

## 2018-04-25 MED ORDER — POLYETHYLENE GLYCOL 3350 17 G PO PACK: 17.0000 g | PACK | Freq: Two times a day (BID) | ORAL | 0 refills | Status: AC

## 2018-04-25 MED ORDER — POLYETHYLENE GLYCOL 3350 17 G PO PACK
17.0000 g | PACK | Freq: Two times a day (BID) | ORAL | Status: DC
  Administered 2018-04-25 – 2018-04-27 (×4): 17 g via ORAL
  Filled 2018-04-25 (×4): qty 1

## 2018-04-25 MED ORDER — MENTHOL 3 MG MT LOZG: 1.0000 | LOZENGE | OROMUCOSAL | Status: DC | PRN

## 2018-04-25 MED ORDER — VANCOMYCIN HCL 1000 MG IV SOLR
INTRAVENOUS | Status: AC
  Filled 2018-04-25: qty 1000

## 2018-04-25 MED ORDER — MIDAZOLAM HCL 2 MG/2ML IJ SOLN
INTRAMUSCULAR | Status: AC
Start: 2018-04-25 — End: 2018-04-25
  Administered 2018-04-25: 2 mg via INTRAVENOUS
  Filled 2018-04-25: qty 2

## 2018-04-25 MED ORDER — DIPHENHYDRAMINE HCL 12.5 MG/5ML PO ELIX
12.5000 mg | ORAL_SOLUTION | ORAL | Status: DC | PRN
  Administered 2018-04-26: 25 mg via ORAL
  Administered 2018-04-26 (×2): 12.5 mg via ORAL
  Filled 2018-04-25: qty 10
  Filled 2018-04-25 (×2): qty 5

## 2018-04-25 MED ORDER — TRANEXAMIC ACID 1000 MG/10ML IV SOLN
INTRAVENOUS | Status: DC | PRN
  Administered 2018-04-25: 2000 mg via TOPICAL

## 2018-04-25 MED ORDER — DEXAMETHASONE SODIUM PHOSPHATE 10 MG/ML IJ SOLN
10.0000 mg | Freq: Once | INTRAMUSCULAR | Status: AC
  Administered 2018-04-25: 10 mg via INTRAVENOUS

## 2018-04-25 MED ORDER — ALUM & MAG HYDROXIDE-SIMETH 200-200-20 MG/5ML PO SUSP: 15.0000 mL | ORAL | Status: DC | PRN

## 2018-04-25 MED ORDER — ONDANSETRON HCL 4 MG PO TABS: 4.0000 mg | ORAL_TABLET | Freq: Four times a day (QID) | ORAL | Status: DC | PRN

## 2018-04-25 MED ORDER — BUPIVACAINE HCL (PF) 0.25 % IJ SOLN
INTRAMUSCULAR | Status: DC | PRN
  Administered 2018-04-25: 30 mL

## 2018-04-25 MED ORDER — FERROUS SULFATE 325 (65 FE) MG PO TABS: 325.0000 mg | ORAL_TABLET | Freq: Three times a day (TID) | ORAL | 3 refills | Status: AC

## 2018-04-25 MED ORDER — PROPOFOL 10 MG/ML IV BOLUS
INTRAVENOUS | Status: DC | PRN
  Administered 2018-04-25: 30 mg via INTRAVENOUS

## 2018-04-25 MED ORDER — FENTANYL CITRATE (PF) 100 MCG/2ML IJ SOLN
50.0000 ug | INTRAMUSCULAR | Status: DC
  Administered 2018-04-25: 100 ug via INTRAVENOUS

## 2018-04-25 MED ORDER — SODIUM CHLORIDE 0.9 % IR SOLN
Status: DC | PRN
  Administered 2018-04-25: 1000 mL

## 2018-04-25 MED ORDER — HYDROCHLOROTHIAZIDE 25 MG PO TABS
25.0000 mg | ORAL_TABLET | Freq: Every day | ORAL | Status: DC
  Administered 2018-04-25 – 2018-04-27 (×3): 25 mg via ORAL
  Filled 2018-04-25 (×3): qty 1

## 2018-04-25 MED ORDER — OXYCODONE HCL 5 MG PO TABS
5.0000 mg | ORAL_TABLET | ORAL | Status: DC | PRN
  Administered 2018-04-25 (×3): 5 mg via ORAL
  Filled 2018-04-25 (×3): qty 1

## 2018-04-25 MED ORDER — FERROUS SULFATE 325 (65 FE) MG PO TABS
325.0000 mg | ORAL_TABLET | Freq: Three times a day (TID) | ORAL | Status: DC
  Administered 2018-04-26 – 2018-04-27 (×4): 325 mg via ORAL
  Filled 2018-04-25 (×5): qty 1

## 2018-04-25 MED ORDER — SODIUM CHLORIDE 0.9 % IV SOLN
INTRAVENOUS | Status: DC
  Administered 2018-04-25 – 2018-04-26 (×2): via INTRAVENOUS

## 2018-04-25 MED ORDER — MIDAZOLAM HCL 2 MG/2ML IJ SOLN
0.5000 mg | INTRAMUSCULAR | Status: DC | PRN
  Administered 2018-04-25: 0.5 mg via INTRAVENOUS

## 2018-04-25 MED ORDER — KETOROLAC TROMETHAMINE 30 MG/ML IJ SOLN
INTRAMUSCULAR | Status: DC | PRN
  Administered 2018-04-25: 30 mg

## 2018-04-25 MED ORDER — PROMETHAZINE HCL 25 MG/ML IJ SOLN: 6.2500 mg | INTRAMUSCULAR | Status: DC | PRN

## 2018-04-25 MED ORDER — METOCLOPRAMIDE HCL 5 MG PO TABS: 5.0000 mg | ORAL_TABLET | Freq: Three times a day (TID) | ORAL | Status: DC | PRN

## 2018-04-25 MED ORDER — DOCUSATE SODIUM 100 MG PO CAPS
100.0000 mg | ORAL_CAPSULE | Freq: Two times a day (BID) | ORAL | Status: DC
  Administered 2018-04-25 – 2018-04-27 (×4): 100 mg via ORAL
  Filled 2018-04-25 (×4): qty 1

## 2018-04-25 MED ORDER — METHOCARBAMOL 500 MG PO TABS: 500.0000 mg | ORAL_TABLET | Freq: Four times a day (QID) | ORAL | 0 refills | Status: AC | PRN

## 2018-04-25 MED ORDER — FENTANYL CITRATE (PF) 100 MCG/2ML IJ SOLN
25.0000 ug | INTRAMUSCULAR | Status: DC | PRN
  Administered 2018-04-25: 50 ug via INTRAVENOUS
  Administered 2018-04-25: 25 ug via INTRAVENOUS

## 2018-04-25 MED ORDER — MIDAZOLAM HCL 2 MG/2ML IJ SOLN
INTRAMUSCULAR | Status: AC
  Administered 2018-04-25: 0.5 mg via INTRAVENOUS
  Filled 2018-04-25: qty 2

## 2018-04-25 MED ORDER — DEXAMETHASONE SODIUM PHOSPHATE 10 MG/ML IJ SOLN
INTRAMUSCULAR | Status: AC
  Filled 2018-04-25: qty 1

## 2018-04-25 MED ORDER — ONDANSETRON HCL 4 MG/2ML IJ SOLN: 4.0000 mg | Freq: Four times a day (QID) | INTRAMUSCULAR | Status: DC | PRN

## 2018-04-25 MED ORDER — ALPRAZOLAM 0.25 MG PO TABS
0.1250 mg | ORAL_TABLET | Freq: Every day | ORAL | Status: DC | PRN
  Administered 2018-04-26: 0.125 mg via ORAL
  Filled 2018-04-25: qty 1

## 2018-04-25 MED ORDER — ONDANSETRON HCL 4 MG/2ML IJ SOLN
INTRAMUSCULAR | Status: DC | PRN
  Administered 2018-04-25: 4 mg via INTRAVENOUS

## 2018-04-25 MED ORDER — OXYCODONE HCL 5 MG PO TABS: 5.0000 mg | ORAL_TABLET | ORAL | 0 refills | Status: DC | PRN

## 2018-04-25 MED ORDER — ASPIRIN 81 MG PO CHEW: 81.0000 mg | CHEWABLE_TABLET | Freq: Two times a day (BID) | ORAL | 0 refills | Status: AC

## 2018-04-25 MED ORDER — SODIUM CHLORIDE 0.9 % IJ SOLN
INTRAMUSCULAR | Status: AC
  Filled 2018-04-25: qty 50

## 2018-04-25 MED ORDER — PHENYLEPHRINE 40 MCG/ML (10ML) SYRINGE FOR IV PUSH (FOR BLOOD PRESSURE SUPPORT)
PREFILLED_SYRINGE | INTRAVENOUS | Status: DC | PRN
  Administered 2018-04-25: 80 ug via INTRAVENOUS

## 2018-04-25 SURGICAL SUPPLY — 51 items
ADH SKN CLS APL DERMABOND .7 (GAUZE/BANDAGES/DRESSINGS) ×1
BAG DECANTER FOR FLEXI CONT (MISCELLANEOUS) IMPLANT
BAG SPEC THK2 15X12 ZIP CLS (MISCELLANEOUS)
BAG ZIPLOCK 12X15 (MISCELLANEOUS) IMPLANT
BANDAGE ACE 6X5 VEL STRL LF (GAUZE/BANDAGES/DRESSINGS) ×2 IMPLANT
BLADE SAW SGTL 11.0X1.19X90.0M (BLADE) ×1 IMPLANT
BLADE SAW SGTL 13.0X1.19X90.0M (BLADE) ×2 IMPLANT
BOWL SMART MIX CTS (DISPOSABLE) ×2 IMPLANT
CAPT KNEE TOTAL 3 ATTUNE ×1 IMPLANT
CEMENT HV SMART SET (Cement) ×2 IMPLANT
COVER SURGICAL LIGHT HANDLE (MISCELLANEOUS) ×2 IMPLANT
CUFF TOURN SGL QUICK 34 (TOURNIQUET CUFF) ×2
CUFF TRNQT CYL 34X4X40X1 (TOURNIQUET CUFF) ×1 IMPLANT
DECANTER SPIKE VIAL GLASS SM (MISCELLANEOUS) ×2 IMPLANT
DERMABOND ADVANCED (GAUZE/BANDAGES/DRESSINGS) ×1
DERMABOND ADVANCED .7 DNX12 (GAUZE/BANDAGES/DRESSINGS) ×1 IMPLANT
DRAPE U-SHAPE 47X51 STRL (DRAPES) ×2 IMPLANT
DRESSING AQUACEL AG SP 3.5X10 (GAUZE/BANDAGES/DRESSINGS) ×1 IMPLANT
DRSG AQUACEL AG SP 3.5X10 (GAUZE/BANDAGES/DRESSINGS) ×2
DURAPREP 26ML APPLICATOR (WOUND CARE) ×4 IMPLANT
ELECT REM PT RETURN 15FT ADLT (MISCELLANEOUS) ×2 IMPLANT
GLOVE BIOGEL PI IND STRL 7.5 (GLOVE) ×1 IMPLANT
GLOVE BIOGEL PI IND STRL 9 (GLOVE) IMPLANT
GLOVE BIOGEL PI INDICATOR 7.5 (GLOVE) ×10
GLOVE BIOGEL PI INDICATOR 9 (GLOVE) ×1
GLOVE SURG SS PI 7.5 STRL IVOR (GLOVE) ×3 IMPLANT
GLOVE SURG SS PI 8.5 STRL IVOR (GLOVE) ×1
GLOVE SURG SS PI 8.5 STRL STRW (GLOVE) IMPLANT
GOWN STRL REUS W/ TWL XL LVL3 (GOWN DISPOSABLE) IMPLANT
GOWN STRL REUS W/TWL 2XL LVL3 (GOWN DISPOSABLE) ×3 IMPLANT
GOWN STRL REUS W/TWL LRG LVL3 (GOWN DISPOSABLE) ×2 IMPLANT
GOWN STRL REUS W/TWL XL LVL3 (GOWN DISPOSABLE) ×4
HANDPIECE INTERPULSE COAX TIP (DISPOSABLE) ×2
HOLDER FOLEY CATH W/STRAP (MISCELLANEOUS) IMPLANT
MANIFOLD NEPTUNE II (INSTRUMENTS) ×2 IMPLANT
NDL SAFETY ECLIPSE 18X1.5 (NEEDLE) IMPLANT
NEEDLE HYPO 18GX1.5 SHARP (NEEDLE)
PACK TOTAL KNEE CUSTOM (KITS) ×2 IMPLANT
POSITIONER SURGICAL ARM (MISCELLANEOUS) ×2 IMPLANT
SET HNDPC FAN SPRY TIP SCT (DISPOSABLE) ×1 IMPLANT
SET PAD KNEE POSITIONER (MISCELLANEOUS) ×2 IMPLANT
SUT MNCRL AB 4-0 PS2 18 (SUTURE) ×2 IMPLANT
SUT STRATAFIX PDS+ 0 24IN (SUTURE) ×2 IMPLANT
SUT VIC AB 1 CT1 36 (SUTURE) ×2 IMPLANT
SUT VIC AB 2-0 CT1 27 (SUTURE) ×6
SUT VIC AB 2-0 CT1 TAPERPNT 27 (SUTURE) ×3 IMPLANT
SYR 3ML LL SCALE MARK (SYRINGE) IMPLANT
SYR 50ML LL SCALE MARK (SYRINGE) ×2 IMPLANT
TRAY FOLEY CATH SILVER 16FR LF (SET/KITS/TRAYS/PACK) ×1 IMPLANT
WRAP KNEE MAXI GEL POST OP (GAUZE/BANDAGES/DRESSINGS) ×2 IMPLANT
YANKAUER SUCT BULB TIP 10FT TU (MISCELLANEOUS) ×2 IMPLANT

## 2018-04-25 NOTE — Anesthesia Procedure Notes (Signed)
Date/Time: 04/25/2018 1:48 PM Performed by: Talbot Grumbling, CRNA Oxygen Delivery Method: Simple face mask

## 2018-04-25 NOTE — Op Note (Signed)
NAME:  Tammy [name]                      MEDICAL RECORD NO.:  [id number]                             FACILITY:  [facility]      PHYSICIAN:  Pietro Cassis. Alvan Dame, M.D.  DATE OF BIRTH:  [birth year]      DATE OF PROCEDURE:  04/25/2018                                     OPERATIVE REPORT         PREOPERATIVE DIAGNOSIS:  Right knee osteoarthritis.      POSTOPERATIVE DIAGNOSIS:  Right knee osteoarthritis.      FINDINGS:  The patient was noted to have complete loss of cartilage and   bone-on-bone arthritis with associated osteophytes in all three compartments of   the knee with a significant synovitis and associated effusion.      PROCEDURE:  Right total knee replacement.      COMPONENTS USED:  DePuy Attune rotating platform posterior stabilized knee   system, a size 4N femur, 4 tibia, size 7 mm PS AOX insert, and 35 anatomic patellar   button.      SURGEON:  Pietro Cassis. Alvan Dame, M.D.      ASSISTANT:  Nehemiah Massed, PA-C.      ANESTHESIA:  Regional and Spinal.      SPECIMENS:  None.      COMPLICATION:  None.      DRAINS:  None.  EBL: <200cc      TOURNIQUET TIME:  27 min at 250 mmHg      The patient was stable to the recovery room.      INDICATION FOR PROCEDURE:  Tammy [name] is a 77 y.o. female patient of   mine.  The patient had been seen, evaluated, and treated conservatively in the   office with medication, activity modification, and injections.  The patient had   radiographic changes of bone-on-bone arthritis with endplate sclerosis and osteophytes noted.      The patient failed conservative measures including medication, injections, and activity modification, and at this point was ready for more definitive measures.   Based on the radiographic changes and failed conservative measures, the patient   decided to proceed with total knee replacement.  Risks of infection,   DVT, component failure, need for revision surgery, postop course, and   expectations were all   discussed and  reviewed.  Consent was obtained for benefit of pain   relief.      PROCEDURE IN DETAIL:  The patient was brought to the operative theater.   Once adequate anesthesia, preoperative antibiotics, 2 gm of Ancef, 1 gm of Tranexamic Acid, and 10 mg of Decadron administered, the patient was positioned supine with the right thigh tourniquet placed.  The  right lower extremity was prepped and draped in sterile fashion.  A time-   out was performed identifying the patient, planned procedure, and   extremity.      The right lower extremity was placed in the [facility] leg holder.  The leg was   exsanguinated, tourniquet elevated to 250 mmHg.  A midline incision was   made followed by median parapatellar arthrotomy.  Following initial   exposure, attention was  first directed to the patella.  Precut   measurement was noted to be 22 mm.  I resected down to 13-14 mm and used a   35 anatomic patellar button to restore patellar height as well as cover the cut   surface.      The lug holes were drilled and a metal shim was placed to protect the   patella from retractors and saw blades.      At this point, attention was now directed to the femur.  The femoral   canal was opened with a drill, irrigated to try to prevent fat emboli.  An   intramedullary rod was passed at 3 degrees valgus, 9 mm of bone was   resected off the distal femur.  Following this resection, the tibia was   subluxated anteriorly.  Using the extramedullary guide, 2 mm of bone was resected off   the proximal lateral tibia.  We confirmed the gap would be   stable medially and laterally with a size 5 spacer block as well as confirmed   the cut was perpendicular in the coronal plane, checking with an alignment rod.      Once this was done, I sized the femur to be a size 4 in the anterior-   posterior dimension, chose a narrow component based on medial and   lateral dimension.  The size 4 rotation block was then pinned in   position anterior  referenced using the C-clamp to set rotation.  The   anterior, posterior, and  chamfer cuts were made without difficulty nor   notching making certain that I was along the anterior cortex to help   with flexion gap stability.      The final box cut was made off the lateral aspect of distal femur.      At this point, the tibia was sized to be a size 4, the size 4 tray was   then pinned in position through the medial third of the tubercle,   drilled, and keel punched.  Trial reduction was now carried with a 4 femur,  4 tibia, a size 6 then 7 mm PS insert, and the 35 anatomic patella botton.  The knee was brought to   extension, full extension with good flexion stability with the patella   tracking through the trochlea without application of pressure.  Given   all these findings the femoral lug holes were drilled and then the trial components removed.  Final components were   opened and cement was mixed.  The knee was irrigated with normal saline   solution and pulse lavage.  The synovial lining was   then injected with 30 cc of 0.25% Marcaine with epinephrine and 1 cc of Toradol plus 30 cc of NS for a total of 61 cc.      The knee was irrigated.  Final implants were then cemented onto clean and   dried cut surfaces of bone with the knee brought to extension with a size 7   mm PS trial insert.      Once the cement had fully cured, the excess cement was removed   throughout the knee.  I confirmed I was satisfied with the range of   motion and stability, and the final size 7 mm PS AOX insert was chosen.  It was   placed into the knee.      The tourniquet had been let down at 27 minutes.  No significant   hemostasis required.  The   extensor mechanism was then reapproximated using #1 Vicryl and #1 Stratafix sutures with the knee   in flexion.  The   remaining wound was closed with 2-0 Vicryl and running 4-0 Monocryl.   The knee was cleaned, dried, dressed sterilely using Dermabond and    Aquacel dressing.  The patient was then   brought to recovery room in stable condition, tolerating the procedure   well.   Please note that Physician Assistant, Nehemiah Massed, PA-C, was present for the entirety of the case, and was utilized for pre-operative positioning, peri-operative retractor management, general facilitation of the procedure.  He was also utilized for primary wound closure at the end of the case.              Pietro Cassis Alvan Dame, M.D.    04/25/2018 1:55 PM

## 2018-04-25 NOTE — Anesthesia Preprocedure Evaluation (Addendum)
Anesthesia Evaluation  Patient identified by MRN, date of birth, ID band Patient awake    Reviewed: Allergy & Precautions, NPO status , Patient's Chart, lab work & pertinent test results  History of Anesthesia Complications Negative for: history of anesthetic complications  Airway Mallampati: II  TM Distance: >3 FB Neck ROM: Full    Dental no notable dental hx. (+) Dental Advisory Given   Pulmonary former smoker,    Pulmonary exam normal        Cardiovascular hypertension, Pt. on medications Normal cardiovascular exam  Impression 2013 Exercise Capacity:  [place] with no exercise. BP Response:  Normal blood pressure response. Clinical Symptoms:  No significant symptoms noted. ECG Impression:  No significant ST segment change suggestive of ischemia. Comparison with Prior Nuclear Study: No previous nuclear study performed  Overall Impression:  Normal stress nuclear study.    Neuro/Psych negative neurological ROS  negative psych ROS   GI/Hepatic Neg liver ROS, GERD  ,  Endo/Other  diabetesMorbid obesity  Renal/GU Renal InsufficiencyRenal disease     Musculoskeletal  (+) Arthritis ,   Abdominal   Peds  Hematology negative hematology ROS (+)   Anesthesia Other Findings Day of surgery medications reviewed with the patient.  Reproductive/Obstetrics                           Anesthesia Physical Anesthesia Plan  ASA: III  Anesthesia Plan: Spinal and MAC   Post-op Pain Management:  Regional for Post-op pain   Induction:   PONV Risk Score and Plan: 2 and Ondansetron and Propofol infusion  Airway Management Planned: Natural Airway and Simple Face Mask  Additional Equipment:   Intra-op Plan:   Post-operative Plan:   Informed Consent: I have reviewed the patients History and Physical, chart, labs and discussed the procedure including the risks, benefits and alternatives for the  proposed anesthesia with the patient or authorized representative who has indicated his/her understanding and acceptance.   Dental advisory given  Plan Discussed with: CRNA and Anesthesiologist  Anesthesia Plan Comments:        Anesthesia Quick Evaluation

## 2018-04-25 NOTE — Interval H&P Note (Signed)
History and Physical Interval Note:  04/25/2018 12:31 PM  Tammy [name]  has presented today for surgery, with the diagnosis of Right Knee Osteoarthritis  The various methods of treatment have been discussed with the patient and family. After consideration of risks, benefits and other options for treatment, the patient has consented to  Procedure(s): RIGHT TOTAL KNEE ARTHROPLASTY (Right) as a surgical intervention .  The patient's history has been reviewed, patient examined, no change in status, stable for surgery.  I have reviewed the patient's chart and labs.  Questions were answered to the patient's satisfaction.     Mauri Pole

## 2018-04-25 NOTE — Transfer of Care (Signed)
Immediate Anesthesia Transfer of Care Note  Patient: Tammy [name]  Procedure(s) Performed: RIGHT TOTAL KNEE ARTHROPLASTY (Right Knee)  Patient Location: PACU  Anesthesia Type:Spinal  Level of Consciousness: awake, alert  and oriented  Airway & Oxygen Therapy: Patient Spontanous Breathing and Patient connected to face mask oxygen  Post-op Assessment: Report given to RN and Post -op Vital signs reviewed and stable  Post vital signs: Reviewed and stable  Last Vitals:  Vitals Value Taken Time  BP    Temp    Pulse 91 04/25/2018  3:55 PM  Resp 16 04/25/2018  3:55 PM  SpO2 99 % 04/25/2018  3:55 PM  Vitals shown include unvalidated device data.  Last Pain:  Vitals:   04/25/18 1229  TempSrc:   PainSc: 0-No pain         Complications: No apparent anesthesia complications

## 2018-04-25 NOTE — Anesthesia Procedure Notes (Signed)
Anesthesia Regional Block: Adductor canal block   Pre-Anesthetic Checklist: ,, timeout performed, Correct Patient, Correct Site, Correct Laterality, Correct Procedure, Correct Position, site marked, Risks and benefits discussed,  Surgical consent,  Pre-op evaluation,  At surgeon's request and post-op pain management  Laterality: Right  Prep: chloraprep       Needles:  Injection technique: Single-shot  Needle Type: Stimulator Needle - 80     Needle Length: 10cm  Needle Gauge: 21     Additional Needles:   Narrative:  Start time: 04/25/2018 1:24 PM End time: 04/25/2018 1:34 PM Injection made incrementally with aspirations every 5 mL.  Performed by: Personally

## 2018-04-25 NOTE — Anesthesia Procedure Notes (Signed)
Spinal  Patient location during procedure: OR Start time: 04/25/2018 1:43 PM End time: 04/25/2018 1:51 PM Staffing Anesthesiologist: Duane Boston, MD Performed: anesthesiologist  Preanesthetic Checklist Completed: patient identified, surgical consent, pre-op evaluation, timeout performed, IV checked, risks and benefits discussed and monitors and equipment checked Spinal Block Patient position: sitting Prep: DuraPrep Patient monitoring: cardiac monitor, continuous pulse ox and blood pressure Approach: midline Location: L2-3 Injection technique: single-shot Needle Needle type: Pencan  Needle gauge: 24 G Needle length: 9 cm Additional Notes Functioning IV was confirmed and monitors were applied. Sterile prep and drape, including hand hygiene and sterile gloves were used. The patient was positioned and the spine was prepped. The skin was anesthetized with lidocaine.  Free flow of clear CSF was obtained prior to injecting local anesthetic into the CSF.  The spinal needle aspirated freely following injection.  The needle was carefully withdrawn.  The patient tolerated the procedure well.

## 2018-04-25 NOTE — Anesthesia Postprocedure Evaluation (Signed)
Anesthesia Post Note  Patient: Tammy [name]  Procedure(s) Performed: RIGHT TOTAL KNEE ARTHROPLASTY (Right Knee)     Patient location during evaluation: PACU Anesthesia Type: MAC and Spinal Level of consciousness: awake and alert Pain management: pain level controlled Vital Signs Assessment: post-procedure vital signs reviewed and stable Respiratory status: spontaneous breathing and respiratory function stable Cardiovascular status: blood pressure returned to baseline and stable Postop Assessment: spinal receding Anesthetic complications: no    Last Vitals:  Vitals:   04/25/18 1645 04/25/18 1700  BP: (!) 144/52 (!) 150/82  Pulse: 64 62  Resp: 11 12  Temp: (!) 36.3 C (!) 36.3 C  SpO2: 100% 100%    Last Pain:  Vitals:   04/25/18 1645  TempSrc:   PainSc: 0-No pain                 Lenox Ladouceur DANIEL

## 2018-04-25 NOTE — Discharge Instructions (Signed)

## 2018-04-25 NOTE — Progress Notes (Signed)
Assisted Dr. Singer with right, ultrasound guided, adductor canal block. Side rails up, monitors on throughout procedure. See vital signs in flow sheet. Tolerated Procedure well.  

## 2018-04-26 ENCOUNTER — Encounter (HOSPITAL_COMMUNITY): Payer: Self-pay | Admitting: Orthopedic Surgery

## 2018-04-26 DIAGNOSIS — M1711 Unilateral primary osteoarthritis, right knee: Secondary | ICD-10-CM | POA: Diagnosis not present

## 2018-04-26 LAB — BASIC METABOLIC PANEL
ANION GAP: 12 (ref 5–15)
BUN: 37 mg/dL — ABNORMAL HIGH (ref 6–20)
CALCIUM: 8.4 mg/dL — AB (ref 8.9–10.3)
CO2: 23 mmol/L (ref 22–32)
CREATININE: 1.69 mg/dL — AB (ref 0.44–1.00)
Chloride: 101 mmol/L (ref 101–111)
GFR calc Af Amer: 32 mL/min — ABNORMAL LOW (ref >60)
GFR calc non Af Amer: 28 mL/min — ABNORMAL LOW (ref >60)
Glucose, Bld: 205 mg/dL — ABNORMAL HIGH (ref 65–99)
Potassium: 4.6 mmol/L (ref 3.5–5.1)
Sodium: 136 mmol/L (ref 135–145)

## 2018-04-26 LAB — CBC
HEMATOCRIT: 33.9 % — AB (ref 36.0–46.0)
Hemoglobin: 10.9 g/dL — ABNORMAL LOW (ref 12.0–15.0)
MCH: 29.2 pg (ref 26.0–34.0)
MCHC: 32.2 g/dL (ref 30.0–36.0)
MCV: 90.9 fL (ref 78.0–100.0)
Platelets: 215 10*3/uL (ref 150–400)
RBC: 3.73 MIL/uL — ABNORMAL LOW (ref 3.87–5.11)
RDW: 13.6 % (ref 11.5–15.5)
WBC: 9.3 10*3/uL (ref 4.0–10.5)

## 2018-04-26 MED ORDER — HYDROCODONE-ACETAMINOPHEN 5-325 MG PO TABS
1.0000 | ORAL_TABLET | ORAL | Status: DC | PRN
  Administered 2018-04-26 – 2018-04-27 (×3): 2 via ORAL
  Filled 2018-04-26 (×3): qty 2

## 2018-04-26 NOTE — Progress Notes (Signed)
Physical Therapy Treatment Patient Details Name: Tammy [name] MRN: [id number] DOB: 05/18/1941 Today's Date: 04/26/2018    History of Present Illness Pt s/p R TKR and with hx of DM    PT Comments    Pt continues motivated and progressing steadily with mobility.  Noted improvement in ambulatory stability and activity tolerance.  Pt hopeful for return home tomorrow.   Follow Up Recommendations  Follow surgeon's recommendation for DC plan and follow-up therapies     Equipment Recommendations  None recommended by PT    Recommendations for Other Services OT consult     Precautions / Restrictions Precautions Precautions: Knee;Fall Restrictions Weight Bearing Restrictions: No Other Position/Activity Restrictions: WBAT    Mobility  Bed Mobility Overal bed mobility: Needs Assistance Bed Mobility: Supine to Sit     Supine to sit: Min assist     General bed mobility comments: Pt up in chair and requests back to same to complete bathing  Transfers Overall transfer level: Needs assistance Equipment used: Rolling walker (2 wheeled) Transfers: Sit to/from Stand Sit to Stand: Min assist         General transfer comment: cues for LE management and use of UEs to self assist  Ambulation/Gait Ambulation/Gait assistance: Min assist Ambulation Distance (Feet): 100 Feet Assistive device: Rolling walker (2 wheeled) Gait Pattern/deviations: Step-to pattern;Decreased step length - right;Decreased step length - left;Shuffle;Trunk flexed Gait velocity: decr   General Gait Details: cues for sequence, posture and position from [employer]             Wheelchair Mobility    Modified Rankin (Stroke Patients Only)       Balance Overall balance assessment: Needs assistance Sitting-balance support: No upper extremity supported;Feet supported Sitting balance-Leahy Scale: Good     Standing balance support: Bilateral upper extremity supported Standing balance-Leahy Scale:  Poor                              Cognition Arousal/Alertness: Awake/alert Behavior During Therapy: WFL for tasks assessed/performed Overall Cognitive Status: History of cognitive impairments - at baseline                                 General Comments: Pt reports memory issues prior to surgery      Exercises Total Joint Exercises Ankle Circles/Pumps: AROM;Both;Supine;20 reps Quad Sets: AROM;Both;10 reps;Supine Heel Slides: AAROM;Right;15 reps;Supine Hip ABduction/ADduction: AAROM;AROM;Right;10 reps;Supine    General Comments        Pertinent Vitals/Pain Pain Assessment: 0-10 Pain Score: 5  Pain Location: R knee Pain Descriptors / Indicators: Aching;Sore Pain Intervention(s): Limited activity within patient's tolerance;Monitored during session;Premedicated before session;Ice applied    Home Living Family/patient expects to be discharged to:: Private residence Living Arrangements: Alone Available Help at Discharge: Family;Available 24 hours/day Type of Home: House Home Access: Stairs to enter   Home Layout: One level Home Equipment: Environmental consultant - 2 wheels;Cane - single point;Bedside commode      Prior Function Level of Independence: Independent with assistive device(s)      Comments: used cane as needed    PT Goals (current goals can now be found in the care plan section) Acute Rehab PT Goals Patient Stated Goal: HOME PT Goal Formulation: With patient Time For Goal Achievement: 05/03/18 Potential to Achieve Goals: Good Progress towards PT goals: Progressing toward goals    Frequency  7X/week      PT Plan Current plan remains appropriate    Co-evaluation              AM-PAC PT "6 Clicks" Daily Activity  Outcome Measure  Difficulty turning over in bed (including adjusting bedclothes, sheets and blankets)?: Unable Difficulty moving from lying on back to sitting on the side of the bed? : Unable Difficulty sitting down on  and standing up from a chair with arms (e.g., wheelchair, bedside commode, etc,.)?: Unable Help needed moving to and from a bed to chair (including a wheelchair)?: A Little Help needed walking in hospital room?: A Little Help needed climbing 3-5 steps with a railing? : A Lot 6 Click Score: 11    End of Session Equipment Utilized During Treatment: Gait belt Activity Tolerance: Patient tolerated treatment well Patient left: in chair;with call bell/phone within reach;with family/visitor present Nurse Communication: Mobility status PT Visit Diagnosis: Unsteadiness on feet (R26.81);Difficulty in walking, not elsewhere classified (R26.2)     Time: 9892-1194 PT Time Calculation (min) (ACUTE ONLY): 31 min  Charges:  $Gait Training: 8-22 mins $Therapeutic Exercise: 8-22 mins                    G Codes:       Pg [phone]    Tamber Burtch 04/26/2018, 3:20 PM

## 2018-04-26 NOTE — Telephone Encounter (Signed)
ERROR

## 2018-04-26 NOTE — Evaluation (Signed)
Physical Therapy Evaluation Patient Details Name: Tammy [name] MRN: [id number] DOB: [birth year] Today's Date: 04/26/2018   History of Present Illness  Pt s/p R TKR and with hx of DM  Clinical Impression  Pt s/p R TKR and presents with functional mobility limitations 2* decreased R LE strength/ROM and post op pain.  Pt should progress to dc home with assist of children.  Pt states she start OP PT 04/28/18.    Follow Up Recommendations Follow surgeon's recommendation for DC plan and follow-up therapies    Equipment Recommendations  None recommended by PT    Recommendations for Other Services OT consult     Precautions / Restrictions Precautions Precautions: Knee;Fall Restrictions Weight Bearing Restrictions: No Other Position/Activity Restrictions: WBAT      Mobility  Bed Mobility Overal bed mobility: Needs Assistance Bed Mobility: Supine to Sit     Supine to sit: Min assist     General bed mobility comments: use of rail and cues for sequence and use of L LE to self assist  Transfers Overall transfer level: Needs assistance Equipment used: Rolling walker (2 wheeled) Transfers: Sit to/from Stand Sit to Stand: Min assist         General transfer comment: cues for LE management and use of UEs to self assist  Ambulation/Gait Ambulation/Gait assistance: Min assist Ambulation Distance (Feet): 85 Feet Assistive device: Rolling walker (2 wheeled) Gait Pattern/deviations: Step-to pattern;Decreased step length - right;Decreased step length - left;Shuffle;Trunk flexed Gait velocity: decr   General Gait Details: cues for sequence, posture and position from [employer]            Wheelchair Mobility    Modified Rankin (Stroke Patients Only)       Balance Overall balance assessment: Needs assistance Sitting-balance support: No upper extremity supported;Feet supported Sitting balance-Leahy Scale: Good     Standing balance support: Bilateral upper extremity  supported Standing balance-Leahy Scale: Poor                               Pertinent Vitals/Pain Pain Assessment: 0-10 Pain Score: 5  Pain Location: R knee Pain Descriptors / Indicators: Aching;Sore Pain Intervention(s): Limited activity within patient's tolerance;Monitored during session;Premedicated before session;Ice applied    Home Living Family/patient expects to be discharged to:: Private residence Living Arrangements: Alone Available Help at Discharge: Family;Available 24 hours/day Type of Home: House Home Access: Stairs to enter   [employer] of Steps: 1 Home Layout: One level Home Equipment: Walker - 2 wheels;Cane - single point;Bedside commode      Prior Function Level of Independence: Independent with assistive device(s)         Comments: used cane as needed      Hand Dominance        Extremity/Trunk Assessment   Upper Extremity Assessment Upper Extremity Assessment: Overall WFL for tasks assessed    Lower Extremity Assessment Lower Extremity Assessment: RLE deficits/detail RLE Deficits / Details: 3/5 quads with AAROM at knee -10 - 60       Communication   Communication: No difficulties  Cognition Arousal/Alertness: Awake/alert Behavior During Therapy: WFL for tasks assessed/performed Overall Cognitive Status: History of cognitive impairments - at baseline                                 General Comments: Pt reports memory issues prior to surgery  General Comments      Exercises Total Joint Exercises Ankle Circles/Pumps: AROM;Both;Supine;20 reps Quad Sets: AROM;Both;10 reps;Supine Heel Slides: AAROM;Right;15 reps;Supine Hip ABduction/ADduction: AAROM;AROM;Right;10 reps;Supine   Assessment/Plan    PT Assessment Patient needs continued PT services  PT Problem List Decreased strength;Decreased range of motion;Decreased activity tolerance;Decreased mobility;Decreased balance;Decreased knowledge of  use of DME;Decreased cognition;Obesity;Pain       PT Treatment Interventions DME instruction;Gait training;Stair training;Functional mobility training;Therapeutic activities;Therapeutic exercise;Patient/family education    PT Goals (Current goals can be found in the Care Plan section)  Acute Rehab PT Goals Patient Stated Goal: HOME PT Goal Formulation: With patient Time For Goal Achievement: 05/03/18 Potential to Achieve Goals: Good    Frequency 7X/week   Barriers to discharge        Co-evaluation               AM-PAC PT "6 Clicks" Daily Activity  Outcome Measure Difficulty turning over in bed (including adjusting bedclothes, sheets and blankets)?: Unable Difficulty moving from lying on back to sitting on the side of the bed? : Unable Difficulty sitting down on and standing up from a chair with arms (e.g., wheelchair, bedside commode, etc,.)?: Unable Help needed moving to and from a bed to chair (including a wheelchair)?: A Little Help needed walking in hospital room?: A Little Help needed climbing 3-5 steps with a railing? : A Lot 6 Click Score: 11    End of Session Equipment Utilized During Treatment: Gait belt Activity Tolerance: Patient tolerated treatment well Patient left: in chair;with call bell/phone within reach;with family/visitor present Nurse Communication: Mobility status PT Visit Diagnosis: Unsteadiness on feet (R26.81);Difficulty in walking, not elsewhere classified (R26.2)    Time: 8381-8403 PT Time Calculation (min) (ACUTE ONLY): 38 min   Charges:   PT Evaluation $PT Eval Low Complexity: 1 Low PT Treatments $Gait Training: 8-22 mins $Therapeutic Exercise: 8-22 mins   PT G Codes:        Pg [phone]   Zakiah Beckerman 04/26/2018, 12:16 PM

## 2018-04-26 NOTE — Progress Notes (Signed)
     Subjective: 1 Day Post-Op Procedure(s) (LRB): RIGHT TOTAL KNEE ARTHROPLASTY (Right)   Patient reports pain as moderate, pain not fully controlled. No events throughout the night  Anticipated LOS equal to or greater than 2 midnights due to - Age 79 and older with one or more of the following:  - Obesity  - Expected need for hospital services (PT, OT, Nursing) required for safe  discharge  - Anticipated need for postoperative skilled nursing care or inpatient rehab  - Active co-morbidities: Diabetes    Objective:   VITALS:   Vitals:   04/26/18 0140 04/26/18 0619  BP: 140/69 (!) 167/88  Pulse: 71 69  Resp: 18 15  Temp: (!) 97.5 F (36.4 C) (!) 97.5 F (36.4 C)  SpO2: 97% 100%    Dorsiflexion/Plantar flexion intact Incision: dressing C/D/I No cellulitis present Compartment soft  LABS Recent Labs    04/25/18 1743 04/26/18 0513  HGB 12.1 10.9*  HCT 37.9 33.9*  WBC 9.2 9.3  PLT 213 215    Recent Labs    04/25/18 1743 04/26/18 0513  NA  --  136  K  --  4.6  BUN  --  37*  CREATININE 1.70* 1.69*  GLUCOSE  --  205*     Assessment/Plan: 1 Day Post-Op Procedure(s) (LRB): RIGHT TOTAL KNEE ARTHROPLASTY (Right) Foley cath d/c'ed Advance diet Up with therapy D/C IV fluids Discharge home when ready  Obese (BMI 30-39.9); Morbid Obesity (BMI >40)  Estimated body mass index is 39.33 kg/m as calculated from the following:   Height as of this encounter: 5\' 7"  (1.702 m).   Weight as of this encounter: 113.9 kg (251 lb 1.7 oz). Patient also counseled that weight may inhibit the healing process Patient counseled that losing weight will help with future health issues       Tammy [name]   PAC  04/26/2018, 8:55 AM

## 2018-04-27 DIAGNOSIS — E669 Obesity, unspecified: Secondary | ICD-10-CM | POA: Diagnosis present

## 2018-04-27 DIAGNOSIS — M1711 Unilateral primary osteoarthritis, right knee: Secondary | ICD-10-CM | POA: Diagnosis not present

## 2018-04-27 LAB — CBC
HCT: 30.7 % — ABNORMAL LOW (ref 36.0–46.0)
Hemoglobin: 9.9 g/dL — ABNORMAL LOW (ref 12.0–15.0)
MCH: 29.1 pg (ref 26.0–34.0)
MCHC: 32.2 g/dL (ref 30.0–36.0)
MCV: 90.3 fL (ref 78.0–100.0)
Platelets: 209 10*3/uL (ref 150–400)
RBC: 3.4 MIL/uL — ABNORMAL LOW (ref 3.87–5.11)
RDW: 13.9 % (ref 11.5–15.5)
WBC: 7.8 10*3/uL (ref 4.0–10.5)

## 2018-04-27 LAB — BASIC METABOLIC PANEL
Anion gap: 10 (ref 5–15)
BUN: 38 mg/dL — AB (ref 6–20)
CHLORIDE: 103 mmol/L (ref 101–111)
CO2: 23 mmol/L (ref 22–32)
CREATININE: 1.76 mg/dL — AB (ref 0.44–1.00)
Calcium: 8.6 mg/dL — ABNORMAL LOW (ref 8.9–10.3)
GFR calc Af Amer: 31 mL/min — ABNORMAL LOW (ref >60)
GFR calc non Af Amer: 27 mL/min — ABNORMAL LOW (ref >60)
Glucose, Bld: 127 mg/dL — ABNORMAL HIGH (ref 65–99)
Potassium: 4.6 mmol/L (ref 3.5–5.1)
SODIUM: 136 mmol/L (ref 135–145)

## 2018-04-27 MED ORDER — HYDROCODONE-ACETAMINOPHEN 7.5-325 MG PO TABS
1.0000 | ORAL_TABLET | ORAL | Status: DC | PRN
  Administered 2018-04-27: 2 via ORAL
  Filled 2018-04-27: qty 2

## 2018-04-27 MED ORDER — HYDROCODONE-ACETAMINOPHEN 7.5-325 MG PO TABS: 1.0000 | ORAL_TABLET | ORAL | 0 refills | Status: AC | PRN

## 2018-04-27 NOTE — Progress Notes (Signed)
Physical Therapy Treatment Patient Details Name: Tammy [name] MRN: [id number] DOB: February 05, 1941 Today's Date: 04/27/2018    History of Present Illness Pt s/p R TKR and with hx of DM    PT Comments    Pt progressing with mobility.  Dtr present to review stairs and home therex with written instruction provided.   Follow Up Recommendations  Follow surgeon's recommendation for DC plan and follow-up therapies     Equipment Recommendations  None recommended by PT    Recommendations for Other Services OT consult     Precautions / Restrictions Precautions Precautions: Knee;Fall Restrictions Weight Bearing Restrictions: No Other Position/Activity Restrictions: WBAT    Mobility  Bed Mobility Overal bed mobility: Needs Assistance Bed Mobility: Supine to Sit     Supine to sit: Min assist     General bed mobility comments: cues for sequence and use of R LE to self assist  Transfers Overall transfer level: Needs assistance Equipment used: Rolling walker (2 wheeled) Transfers: Sit to/from Stand Sit to Stand: Min guard         General transfer comment: cues for LE management and use of UEs to self assist  Ambulation/Gait Ambulation/Gait assistance: Min guard Ambulation Distance (Feet): 100 Feet Assistive device: Rolling walker (2 wheeled) Gait Pattern/deviations: Decreased step length - right;Decreased step length - left;Shuffle;Trunk flexed;Step-to pattern;Step-through pattern Gait velocity: decr   General Gait Details: cues for sequence, posture and position from RW   Stairs Stairs: Yes Stairs assistance: Min assist Stair Management: No rails;Step to pattern;Backwards;Forwards;With walker Number of Stairs: 2 General stair comments: single step both bkwd and fwd with RW and with dtr present   Wheelchair Mobility    Modified Rankin (Stroke Patients Only)       Balance                                            Cognition  Arousal/Alertness: Awake/alert Behavior During Therapy: WFL for tasks assessed/performed Overall Cognitive Status: History of cognitive impairments - at baseline                                 General Comments: Pt reports memory issues prior to surgery      Exercises Total Joint Exercises Ankle Circles/Pumps: AROM;Both;Supine;20 reps Quad Sets: AROM;Both;10 reps;Supine Heel Slides: AAROM;Right;15 reps;Supine Straight Leg Raises: AAROM;AROM;Right;15 reps;Supine    General Comments        Pertinent Vitals/Pain Pain Assessment: 0-10 Pain Score: 6  Pain Location: R knee Pain Descriptors / Indicators: Aching;Sore Pain Intervention(s): Limited activity within patient's tolerance;Monitored during session;Premedicated before session;Ice applied    Home Living                      Prior Function            PT Goals (current goals can now be found in the care plan section) Acute Rehab PT Goals Patient Stated Goal: HOME PT Goal Formulation: With patient Time For Goal Achievement: 05/03/18 Potential to Achieve Goals: Good Progress towards PT goals: Progressing toward goals    Frequency    7X/week      PT Plan Current plan remains appropriate    Co-evaluation              AM-PAC PT "6 Clicks" Daily Activity  Outcome  Measure  Difficulty turning over in bed (including adjusting bedclothes, sheets and blankets)?: Unable Difficulty moving from lying on back to sitting on the side of the bed? : Unable Difficulty sitting down on and standing up from a chair with arms (e.g., wheelchair, bedside commode, etc,.)?: A Lot Help needed moving to and from a bed to chair (including a wheelchair)?: A Little Help needed walking in hospital room?: A Little Help needed climbing 3-5 steps with a railing? : A Little 6 Click Score: 13    End of Session Equipment Utilized During Treatment: Gait belt Activity Tolerance: Patient tolerated treatment  well Patient left: in chair;with call bell/phone within reach;with family/visitor present Nurse Communication: Mobility status PT Visit Diagnosis: Unsteadiness on feet (R26.81);Difficulty in walking, not elsewhere classified (R26.2)     Time: 0071-2197 PT Time Calculation (min) (ACUTE ONLY): 47 min  Charges:  $Gait Training: 8-22 mins $Therapeutic Exercise: 8-22 mins $Therapeutic Activity: 8-22 mins                    G Codes:       Pg [phone]    Zoe Goonan 04/27/2018, 1:00 PM

## 2018-04-27 NOTE — Progress Notes (Signed)
Patient ID: Tammy [name], female   DOB: 11-23-1941, 77 y.o.   MRN: [id number] Subjective: 2 Days Post-Op Procedure(s) (LRB): RIGHT TOTAL KNEE ARTHROPLASTY (Right)    Patient reports pain as moderate with some swelling. Able to get sleep last night.  No events  Objective:   VITALS:   Vitals:   04/26/18 2107 04/27/18 0551  BP: (!) 141/76 136/75  Pulse: (!) 52 87  Resp:  14  Temp: 98.6 F (37 C) 98.2 F (36.8 C)  SpO2: 98% 96%    Neurovascular intact Incision: dressing C/D/I  LABS Recent Labs    04/25/18 1743 04/26/18 0513 04/27/18 0547  HGB 12.1 10.9* 9.9*  HCT 37.9 33.9* 30.7*  WBC 9.2 9.3 7.8  PLT 213 215 209    Recent Labs    04/25/18 1743 04/26/18 0513 04/27/18 0547  NA  --  136 136  K  --  4.6 4.6  BUN  --  37* 38*  CREATININE 1.70* 1.69* 1.76*  GLUCOSE  --  205* 127*    No results for input(s): LABPT, INR in the last 72 hours.   Assessment/Plan: 2 Days Post-Op Procedure(s) (LRB): RIGHT TOTAL KNEE ARTHROPLASTY (Right)   Up with therapy  Home today. PT tomorrow at office Rx on chart RTC 5/30, appt made

## 2018-05-02 NOTE — Discharge Summary (Signed)
Physician Discharge Summary  Patient ID: Tammy [name] MRN: [id number] DOB/AGE: 1941-06-12 77 y.o.  Admit date: 04/25/2018 Discharge date: 04/27/2018   Procedures:  Procedure(s) (LRB): RIGHT TOTAL KNEE ARTHROPLASTY (Right)  Attending Physician:  Dr. Paralee Cancel   Admission Diagnoses:   Right knee primary OA / pain  Discharge Diagnoses:  Principal Problem:   S/P right TKA Active Problems:   Obese  Past Medical History:  Diagnosis Date  . Adenomatous colon polyp   . Arthritis   . Diabetes mellitus    type II controlled with diet  . GERD (gastroesophageal reflux disease)   . HPV (human papilloma virus) anogenital infection   . Hypertension     HPI:    Tammy [name], 77 y.o. female, has a history of pain and functional disability in the right knee due to arthritis and has failed non-surgical conservative treatments for greater than 12 weeks to includeNSAID's and/or analgesics, corticosteriod injections, use of assistive devices and activity modification.  Onset of symptoms was gradual, starting 2+ years ago with gradually worsening course since that time. The patient noted no past surgery on the right knee(s).  Patient currently rates pain in the right knee(s) at 8 out of 10 with activity. Patient has night pain, worsening of pain with activity and weight bearing, pain that interferes with activities of daily living, pain with passive range of motion, crepitus and joint swelling.  Patient has evidence of periarticular osteophytes and joint space narrowing by imaging studies. There is no active infection.  Risks, benefits and expectations were discussed with the patient.  Risks including but not limited to the risk of anesthesia, blood clots, nerve damage, blood vessel damage, failure of the prosthesis, infection and up to and including death.  Patient understand the risks, benefits and expectations and wishes to proceed with surgery.   PCP: Burnard Bunting, MD   Discharged  Condition: good  Hospital Course:  Patient underwent the above stated procedure on 04/25/2018. Patient tolerated the procedure well and brought to the recovery room in good condition and subsequently to the floor.  POD #1 BP: 167/88 ; Pulse: 69 ; Temp: 97.5 F (36.4 C) ; Resp: 15 Patient reports pain as moderate, pain not fully controlled. No events throughout the night Dorsiflexion/plantar flexion intact, incision: dressing C/D/I, no cellulitis present and compartment soft.   LABS  Basename    HGB     10.9  HCT     33.9   POD #2  BP: 136/75 ; Pulse: 87 ; Temp: 98.2 F (36.8 C) ; Resp: 14 Patient reports pain as moderate with some swelling. Able to get sleep last night.  No events. Ready to be discharged home. Neurovascular intact and incision: dressing C/D/I.   LABS  Basename    HGB     9.9  HCT     30.7    Discharge Exam: General appearance: alert, cooperative and no distress Extremities: Homans sign is negative, no sign of DVT, no edema, redness or tenderness in the calves or thighs and no ulcers, gangrene or trophic changes  Disposition:  Home with follow up in 2 weeks   Follow-up Information    Paralee Cancel, MD. Schedule an appointment as soon as possible for a visit in 2 weeks.   Specialty:  Orthopedic Surgery Contact information: [address] [place] [address] [phone]           Discharge Instructions    Call MD / Call 911  Complete by:  As directed    If you experience chest pain or shortness of breath, CALL 911 and be transported to the hospital emergency room.  If you develope a fever above 101 F, pus (white drainage) or increased drainage or redness at the wound, or calf pain, call your surgeon's office.   Change dressing   Complete by:  As directed    Maintain surgical dressing until follow up in the clinic. If the edges start to pull up, may reinforce with tape. If the dressing is no longer working, may remove and cover with  gauze and tape, but must keep the area dry and clean.  Call with any questions or concerns.   Constipation Prevention   Complete by:  As directed    Drink plenty of fluids.  Prune juice may be helpful.  You may use a stool softener, such as Colace (over the counter) 100 mg twice a day.  Use MiraLax (over the counter) for constipation as needed.   Diet - low sodium heart healthy   Complete by:  As directed    Discharge instructions   Complete by:  As directed    Maintain surgical dressing until follow up in the clinic. If the edges start to pull up, may reinforce with tape. If the dressing is no longer working, may remove and cover with gauze and tape, but must keep the area dry and clean.  Follow up in 2 weeks at [facility]. Call with any questions or concerns.   Increase activity slowly as tolerated   Complete by:  As directed    Weight bearing as tolerated with assist device (walker, cane, etc) as directed, use it as long as suggested by your surgeon or therapist, typically at least 4-6 weeks.   TED hose   Complete by:  As directed    Use stockings (TED hose) for 2 weeks on both leg(s).  You may remove them at night for sleeping.      Allergies as of 04/27/2018      Reactions   Adhesive [tape] Other (See Comments)   Skin tears  Use PAPER TAPE ONLY   Doxycycline Rash      Medication List    STOP taking these medications   aspirin EC 81 MG tablet Replaced by:  aspirin 81 MG chewable tablet   HYDROcodone-acetaminophen 5-325 MG tablet Commonly known as:  NORCO/VICODIN Replaced by:  HYDROcodone-acetaminophen 7.5-325 MG tablet   naproxen 500 MG tablet Commonly known as:  NAPROSYN     TAKE these medications   acetaminophen 500 MG tablet Commonly known as:  TYLENOL Take 2 tablets (1,000 mg total) by mouth every 8 (eight) hours.   ALPRAZolam 0.25 MG tablet Commonly known as:  XANAX Take 0.125 mg by mouth daily as needed for anxiety.   aspirin 81 MG chewable  tablet Commonly known as:  ASPIRIN CHILDRENS Chew 1 tablet (81 mg total) by mouth 2 (two) times daily. Start the day after finishing the Lovenox. Take for 4 weeks, then resume regular dose. Start taking on:  05/12/2018 Replaces:  aspirin EC 81 MG tablet   Biotin 10000 MCG Tabs Take 10,000 mcg by mouth daily.   BYSTOLIC 5 MG tablet Generic drug:  nebivolol Take 5 mg by mouth daily.   CINNAMON PO Take 1,000 mg by mouth 2 (two) times daily.   diclofenac sodium 1 % Gel Commonly known as:  VOLTAREN Apply 4 g topically 4 (four) times daily as needed for pain.  docusate sodium 100 MG capsule Commonly known as:  COLACE Take 1 capsule (100 mg total) by mouth 2 (two) times daily.   enoxaparin 40 MG/0.4ML injection Commonly known as:  LOVENOX Inject 0.4 mLs (40 mg total) into the skin daily for 14 days.   ferrous sulfate 325 (65 FE) MG tablet Commonly known as:  FERROUSUL Take 1 tablet (325 mg total) by mouth 3 (three) times daily with meals.   HYDROcodone-acetaminophen 7.5-325 MG tablet Commonly known as:  NORCO Take 1-2 tablets by mouth every 4 (four) hours as needed for moderate pain or severe pain. Replaces:  HYDROcodone-acetaminophen 5-325 MG tablet   losartan-hydrochlorothiazide 100-25 MG tablet Commonly known as:  HYZAAR Take 1 tablet by mouth Daily.   MAG-200 PO Take 200 mg by mouth daily. MAGNESIUM MALATE 1300 MG   methocarbamol 500 MG tablet Commonly known as:  ROBAXIN Take 1 tablet (500 mg total) by mouth every 6 (six) hours as needed for muscle spasms.   multivitamin with minerals Tabs tablet Take 1 tablet by mouth daily. CENTRUM SILVER   polyethylene glycol packet Commonly known as:  MIRALAX / GLYCOLAX Take 17 g by mouth 2 (two) times daily.   Potassium 99 MG Tabs Take 99 mg by mouth daily.   ranitidine 150 MG tablet Commonly known as:  ZANTAC Take 150 mg by mouth 2 (two) times daily.            Discharge Care Instructions  (From admission,  onward)        Start     Ordered   04/27/18 0000  Change dressing    Comments:  Maintain surgical dressing until follow up in the clinic. If the edges start to pull up, may reinforce with tape. If the dressing is no longer working, may remove and cover with gauze and tape, but must keep the area dry and clean.  Call with any questions or concerns.   04/27/18 8341       Signed: West Pugh. Kunta Hilleary   PA-C  05/02/2018, 9:38 AM

## 2018-05-07 ENCOUNTER — Emergency Department (HOSPITAL_COMMUNITY): Payer: Medicare Other

## 2018-05-07 ENCOUNTER — Emergency Department (HOSPITAL_COMMUNITY)
Admission: EM | Admit: 2018-05-07 | Discharge: 2018-05-07 | Disposition: A | Discharge status: Home / Self Care | Payer: Medicare Other | Attending: Emergency Medicine | Admitting: Emergency Medicine

## 2018-05-07 ENCOUNTER — Emergency Department (HOSPITAL_BASED_OUTPATIENT_CLINIC_OR_DEPARTMENT_OTHER)
Admit: 2018-05-07 | Discharge: 2018-05-07 | Disposition: A | Discharge status: Home / Self Care | Payer: Medicare Other | Attending: Emergency Medicine | Admitting: Emergency Medicine

## 2018-05-07 DIAGNOSIS — R2241 Localized swelling, mass and lump, right lower limb: Secondary | ICD-10-CM | POA: Diagnosis not present

## 2018-05-07 DIAGNOSIS — M79609 Pain in unspecified limb: Secondary | ICD-10-CM

## 2018-05-07 DIAGNOSIS — Z79899 Other long term (current) drug therapy: Secondary | ICD-10-CM | POA: Insufficient documentation

## 2018-05-07 DIAGNOSIS — Z96651 Presence of right artificial knee joint: Secondary | ICD-10-CM | POA: Insufficient documentation

## 2018-05-07 DIAGNOSIS — I10 Essential (primary) hypertension: Secondary | ICD-10-CM | POA: Insufficient documentation

## 2018-05-07 DIAGNOSIS — Z9889 Other specified postprocedural states: Secondary | ICD-10-CM

## 2018-05-07 DIAGNOSIS — E119 Type 2 diabetes mellitus without complications: Secondary | ICD-10-CM | POA: Insufficient documentation

## 2018-05-07 DIAGNOSIS — Z87891 Personal history of nicotine dependence: Secondary | ICD-10-CM | POA: Insufficient documentation

## 2018-05-07 DIAGNOSIS — M7731 Calcaneal spur, right foot: Secondary | ICD-10-CM | POA: Diagnosis not present

## 2018-05-07 DIAGNOSIS — M79671 Pain in right foot: Secondary | ICD-10-CM | POA: Diagnosis present

## 2018-05-07 DIAGNOSIS — M7989 Other specified soft tissue disorders: Secondary | ICD-10-CM

## 2018-05-07 LAB — CBC WITH DIFFERENTIAL/PLATELET
BASOS ABS: 0 10*3/uL (ref 0.0–0.1)
Basophils Relative: 0 %
EOS PCT: 2 %
Eosinophils Absolute: 0.2 10*3/uL (ref 0.0–0.7)
HCT: 41 % (ref 36.0–46.0)
Hemoglobin: 13.3 g/dL (ref 12.0–15.0)
LYMPHS ABS: 1.1 10*3/uL (ref 0.7–4.0)
LYMPHS PCT: 17 %
MCH: 29.6 pg (ref 26.0–34.0)
MCHC: 32.4 g/dL (ref 30.0–36.0)
MCV: 91.3 fL (ref 78.0–100.0)
MONO ABS: 0.5 10*3/uL (ref 0.1–1.0)
MONOS PCT: 8 %
Neutro Abs: 4.6 10*3/uL (ref 1.7–7.7)
Neutrophils Relative %: 73 %
Platelets: 268 10*3/uL (ref 150–400)
RBC: 4.49 MIL/uL (ref 3.87–5.11)
RDW: 14.4 % (ref 11.5–15.5)
WBC: 6.3 10*3/uL (ref 4.0–10.5)

## 2018-05-07 LAB — BASIC METABOLIC PANEL
ANION GAP: 10 (ref 5–15)
BUN: 21 mg/dL — AB (ref 6–20)
CALCIUM: 9.1 mg/dL (ref 8.9–10.3)
CO2: 24 mmol/L (ref 22–32)
CREATININE: 1.27 mg/dL — AB (ref 0.44–1.00)
Chloride: 105 mmol/L (ref 101–111)
GFR calc Af Amer: 46 mL/min — ABNORMAL LOW (ref >60)
GFR, EST NON AFRICAN AMERICAN: 39 mL/min — AB (ref >60)
GLUCOSE: 120 mg/dL — AB (ref 65–99)
Potassium: 4.2 mmol/L (ref 3.5–5.1)
Sodium: 139 mmol/L (ref 135–145)

## 2018-05-07 LAB — I-STAT CG4 LACTIC ACID, ED: Lactic Acid, Venous: 1.19 mmol/L (ref 0.5–1.9)

## 2018-05-07 MED ORDER — HYDROMORPHONE HCL 2 MG PO TABS
4.0000 mg | ORAL_TABLET | Freq: Once | ORAL | Status: AC
  Administered 2018-05-07: 4 mg via ORAL
  Filled 2018-05-07: qty 2

## 2018-05-07 MED ORDER — ACETAMINOPHEN 500 MG PO TABS
1000.0000 mg | ORAL_TABLET | Freq: Once | ORAL | Status: AC
  Administered 2018-05-07: 1000 mg via ORAL
  Filled 2018-05-07: qty 2

## 2018-05-07 MED ORDER — CEPHALEXIN 500 MG PO CAPS: 500.0000 mg | ORAL_CAPSULE | Freq: Two times a day (BID) | ORAL | 0 refills | Status: AC

## 2018-05-07 NOTE — Discharge Instructions (Signed)
It was my pleasure taking care of you today!   Please take all of your antibiotics until finished!   Please keep your follow up appointment with the orthopedic doctor.   Return to ER for new or worsening symptoms, any additional concerns.

## 2018-05-07 NOTE — ED Notes (Addendum)
Bed: WK08 Expected date:  Expected time:  Means of arrival:  Comments: EMS 77 yo female from home-knee replacement 9 days ago-now foot pain-minimal swelling

## 2018-05-07 NOTE — ED Notes (Signed)
Right knee is covered and bandage/ warm to touch slightly. Pt mainly concerned with right heel (rest, ice and elevation applied )

## 2018-05-07 NOTE — ED Notes (Signed)
Patient and family requesting IV team start her IV

## 2018-05-07 NOTE — ED Triage Notes (Signed)
Patient arrives by [facility] with complaints of right foot pain-patient had a knee replacement on May 16th. Patient had a F/U last Wednesday-appointment was fine-at 1930 Friday night she developed pain and slight swelling to her right foot. 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 Patient is on Lovenox. No coldness or discoloration to right foot per PTAR.

## 2018-05-07 NOTE — Progress Notes (Signed)
Preliminary notes--Right lower extremity venous exam completed. Limited study due to patient could not tolerant compression procedure to perform. Negative for DVT where exam could approach.   Calf veins not fully evaluated due to patient refusing the compression, could not exclude the calf veins thrombosis if just based on poor flow detected.  Incidental findings:  A 2.66x1.24x4.53cm complex fluid collection seen at right popliteal fossa.  Result notified ED RN Tim.   Tammy [name] (RDMS RVT) 05/07/18 9:49 AM

## 2018-05-07 NOTE — ED Notes (Signed)
Patient voices need to urinate-patient given female urinal-unable to void-external catheter placed

## 2018-05-07 NOTE — ED Notes (Signed)
Patient complaining of severe heel pain and states now she is unable to bear weight on the foot/heel

## 2018-05-07 NOTE — ED Provider Notes (Signed)
[facility] DEPT Provider Note   CSN: [id number] Arrival date & time: 05/07/18  0020     History   Chief Complaint Chief Complaint  Patient presents with  . Right foot pain    HPI Tammy [name] is a 77 y.o. female with history of hypertension, osteoarthritis status post right total knee arthroplasty by Dr. Alvan Dame  on 04/25/2018 is here for evaluation of right foot pain onset Friday night.  Pain is severe.  Pain is worse at the left heel and sole of the foot.  Pain radiates up into the left posterior calf.  Pain is associated with swelling, redness, warmth, blisters of the left lower extremity.  Pain is aggravated by direct palpation, walking and putting weight directly on the heel.  Usually walks with a walker but now unable to put foot down on the ground secondary to pain.  Has been compliant with Lovenox injections after right knee surgery.  She has history of right DVT.  She has history of leg cellulitis that has required admission for IV antibiotics.  She denies fevers, chills, body aches, distal paresthesias or numbness, direct new trauma.  She has no chest pain or shortness of breath.  Pain has been refractory to her prescribed Dilaudid after surgery. She has scheduled f/u with Dr Alvan Dame 5/30. She has scheduled PT on 5/28. HPI  Past Medical History:  Diagnosis Date  . Adenomatous colon polyp   . Arthritis   . Diabetes mellitus    type II controlled with diet  . GERD (gastroesophageal reflux disease)   . HPV (human papilloma virus) anogenital infection   . Hypertension     Patient Active Problem List   Diagnosis Date Noted  . Obese 04/27/2018  . 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 Palpitations 05/13/2016    Past Surgical History:  Procedure Laterality Date  . CATARACT EXTRACTION    . COLONOSCOPY    . TOTAL KNEE ARTHROPLASTY Right 04/25/2018   Procedure: RIGHT TOTAL KNEE ARTHROPLASTY;  Surgeon: Paralee Cancel, MD;  Location: [facility] ORS;  Service: Orthopedics;   Laterality: Right;  Adductor Block  . VAGINAL HYSTERECTOMY  1977     OB History   None      Home Medications    Prior to Admission medications   Medication Sig Start Date End Date Taking? Authorizing Provider  acetaminophen (TYLENOL) 500 MG tablet Take 2 tablets (1,000 mg total) by mouth every 8 (eight) hours. Patient taking differently: Take 1,000 mg by mouth every 8 (eight) hours as needed for mild pain, moderate pain or headache.  04/25/18  Yes Babish, Rodman Key, PA-C  ALPRAZolam Duanne Moron) 0.25 MG tablet Take 0.125 mg by mouth daily as needed for anxiety. 01/16/18  Yes [provider]  Biotin 10000 MCG TABS Take 10,000 mcg by mouth daily.   Yes [provider]  BYSTOLIC 5 MG tablet Take 5 mg by mouth daily. 03/17/18  Yes [provider]  CINNAMON PO Take 1,000 mg by mouth 2 (two) times daily.    Yes [provider]  diclofenac sodium (VOLTAREN) 1 % GEL Apply 4 g topically 4 (four) times daily as needed for pain. 04/05/18  Yes [provider]  docusate sodium (COLACE) 100 MG capsule Take 1 capsule (100 mg total) by mouth 2 (two) times daily. 04/25/18  Yes Babish, Rodman Key, PA-C  enoxaparin (LOVENOX) 40 MG/0.4ML injection Inject 0.4 mLs (40 mg total) into the skin daily for 14 days. 04/27/18 05/11/18 Yes Danae Orleans, PA-C  ferrous sulfate (FERROUSUL) 325 (65 FE) MG tablet Take 1 tablet (325 mg total) by mouth 3 (three) times daily with meals. 04/25/18  Yes Babish, Rodman Key, PA-C  HYDROmorphone (DILAUDID) 2 MG tablet Take 2-4 mg by mouth every 6 (six) hours as needed for moderate pain or severe pain.  05/03/18  Yes [provider]  losartan-hydrochlorothiazide (HYZAAR) 100-25 MG per tablet Take 1 tablet by mouth Daily. 07/19/11  Yes [provider]  Magnesium Oxide (MAG-200 PO) Take 200 mg by mouth daily. MAGNESIUM MALATE 1300 MG   Yes [provider]  methocarbamol (ROBAXIN) 500 MG tablet Take 1 tablet (500 mg total) by mouth every  6 (six) hours as needed for muscle spasms. 04/25/18  Yes Babish, Rodman Key, PA-C  Multiple Vitamin (MULTIVITAMIN WITH MINERALS) TABS tablet Take 1 tablet by mouth daily. CENTRUM SILVER   Yes [provider]  polyethylene glycol (MIRALAX / GLYCOLAX) packet Take 17 g by mouth 2 (two) times daily. 04/25/18  Yes Danae Orleans, PA-C  Potassium 99 MG TABS Take 99 mg by mouth daily.   Yes [provider]  ranitidine (ZANTAC) 150 MG tablet Take 150 mg by mouth 2 (two) times daily.  07/19/11  Yes [provider]  aspirin (ASPIRIN CHILDRENS) 81 MG chewable tablet Chew 1 tablet (81 mg total) by mouth 2 (two) times daily. Start the day after finishing the Lovenox. Take for 4 weeks, then resume regular dose. 05/12/18 06/11/18  Danae Orleans, PA-C  HYDROcodone-acetaminophen (NORCO) 7.5-325 MG tablet Take 1-2 tablets by mouth every 4 (four) hours as needed for moderate pain or severe pain. Patient not taking: Reported on 05/07/2018 04/27/18   Danae Orleans, PA-C    Family History Family History  Problem Relation Age of Onset  . Colon polyps Maternal Uncle   . Cancer Mother        breast, died age 39  . Heart attack Mother 77  . Stroke Maternal Grandmother 68  . Heart attack Maternal Grandfather 55  . Cancer Brother   . Heart attack Brother 36    Social History Social History   Tobacco Use  . Smoking status: Former Smoker    Packs/day: 0.33    Years: 22.00    Pack years: 7.26    Types: Cigarettes  . Smokeless tobacco: Never Used  Substance Use Topics  . Alcohol use: No    Alcohol/week: 0.0 oz  . Drug use: No     Allergies   Adhesive [tape] and Doxycycline   Review of Systems Review of Systems  Cardiovascular: Positive for leg swelling.       Right calf pain  Musculoskeletal: Positive for arthralgias and joint swelling.  Skin: Positive for color change.  Hematological: Bruises/bleeds easily (post op lovenox).  All other systems reviewed and are  negative.    Physical Exam Updated Vital Signs BP 134/62 (BP Location: Left Arm)   Pulse 74   Temp 98.5 F (36.9 C) (Oral)   Resp 18   SpO2 97%   Physical Exam  Constitutional: She is oriented to person, place, and time. She appears well-developed and well-nourished. No distress.  Non toxic  HENT:  Head: Normocephalic and atraumatic.  Nose: Nose normal.  Moist mucous membranes   Eyes: Pupils are equal, round, and reactive to light. Conjunctivae and EOM are normal.  Neck: Normal range of motion.  Cardiovascular: Normal rate and regular rhythm.  R LE edema from midfoot to above-knee.  TTP to distal right calf.  2+ DP and  radial pulses bilaterally.   Pulmonary/Chest: Effort normal and breath sounds normal.  Abdominal: Soft. There is no tenderness.  Musculoskeletal: Normal range of motion. She exhibits tenderness.  Right ankle: Exquisite tenderness to the right calcaneous  and mid plantar aspect of foot.  Mild focal bony tenderness to malleoli.  Mild pain with active range of motion of the ankle.  Posterior right calf tenderness, compartments soft.   Neurological: She is alert and oriented to person, place, and time.  Skin: Skin is warm and dry. Capillary refill takes less than 2 seconds. There is erythema.  Erythema, warmth around surgical site per family members redness is not new and has been there since surgery.  There is no new erythema and warmth to distal tip/fib.  Psychiatric: She has a normal mood and affect. Her behavior is normal.  Nursing note and vitals reviewed.    ED Treatments / Results  Labs (all labs ordered are listed, but only abnormal results are displayed) Labs Reviewed  BASIC METABOLIC PANEL - Abnormal; Notable for the following components:      Result Value   Glucose, Bld 120 (*)    BUN 21 (*)    Creatinine, Ser 1.27 (*)    GFR calc non Af Amer 39 (*)    GFR calc Af Amer 46 (*)    All other components within normal limits  CBC WITH  DIFFERENTIAL/PLATELET  I-STAT CG4 LACTIC ACID, ED    EKG None  Radiology Dg Tibia/fibula Right  Result Date: 05/07/2018 CLINICAL DATA:  Right foot pain. Recent knee replacement. Swelling. EXAM: RIGHT TIBIA AND FIBULA - 2 VIEW COMPARISON:  None. FINDINGS: Right total-knee arthroplasty with well seated components. No abnormal perihardware lucency. Generalized soft tissue swelling but no soft tissue gas. No acute abnormality. IMPRESSION: No adverse features of right TKA. Electronically Signed   By: Ulyses Jarred M.D.   On: 05/07/2018 04:24   Dg Ankle 2 Views Right  Result Date: 05/07/2018 CLINICAL DATA:  Right foot pain. Knee replacement on May 16th. Pain and swelling developed on Friday night. History of left leg DVT. EXAM: RIGHT ANKLE - 2 VIEW COMPARISON:  None. FINDINGS: Mild soft tissue swelling along the right ankle. No evidence of acute fracture or dislocation. No focal bone lesions. Moderate-sized plantar calcaneal spur with small osseous fragments may indicate plantar fasciitis. IMPRESSION: No acute bony abnormalities. Soft tissue swelling. Prominent plantar calcaneal spur with small osseous fragments may indicate plantar fasciitis. Electronically Signed   By: Lucienne Capers M.D.   On: 05/07/2018 03:41    Procedures Procedures (including critical care time)  Medications Ordered in ED Medications  HYDROmorphone (DILAUDID) tablet 4 mg (4 mg Oral Given 05/07/18 0358)  acetaminophen (TYLENOL) tablet 1,000 mg (1,000 mg Oral Given 05/07/18 0358)     Initial Impression / Assessment and Plan / ED Course  I have reviewed the triage vital signs and the nursing notes.  Pertinent labs & imaging results that were available during my care of the patient were reviewed by me and considered in my medical decision making (see chart for details).  Clinical Course as of May 08 635  Sun May 07, 2018  6144 5/30 follow up with Dr. Alvan Dame    [CG]  [phone] FINDINGS: Mild soft tissue swelling along the  right ankle. No evidence of acute fracture or dislocation. No focal bone lesions. Moderate-sized plantar calcaneal spur with small osseous fragments may indicate plantar fasciitis.    DG Ankle 2 Views Right [CG]  [phone]  FINDINGS: Right total-knee arthroplasty with well seated components. No abnormal perihardware lucency. Generalized soft tissue swelling but no soft tissue gas. No acute abnormality.    DG Tibia/Fibula Right [CG]    Clinical Course User Index [CG] Kinnie Feil, PA-C   ddx include intraarticular injury, OA, cellulitis, DVT, osteomyelitis. Highest suspicion for cellulitis vs DVT. Although she is on lovenox and compliant. Will obtain x-rays, screenings lab. Analgesia given.   0600: Labs unremarkable. X-rays show generalized soft tissue swelling along tib/fib and ankle and moderate sized plantar calcaneal spur suggestive of plantar fasciitis.   Final Clinical Impressions(s) / ED Diagnoses   0630: Pt discussed with Dr. Roxanne Mins.  Ddx includes cellulitis vs DVT, in setting of bony spur which may be contributing to heel/plantar pain. Will order LE vascular ultrasound to r/o DVT. If positive, consider doubling lovenox home and discharge with hematology f/u for coag work up. If negative, consider one dose of IV abx and dc with clindamycin and close f/u in 48-72 hours. Discussed this plan with patient and family at bedside, they are in agreement. Pt has PT on 5/28 and f/u with Dr. Alvan Dame 5/30. Pt handed off to oncoming EDPA Ward who will f/u on vascular US and discharge patient.  Final diagnoses:  Calcaneal spur of right foot  Right leg swelling    ED Discharge Orders    None       Arlean Hopping 10/93/23 [id number]    Delora Fuel, MD 22/02/54 2248

## 2018-05-07 NOTE — ED Provider Notes (Signed)
Care assumed from previous provider PA Gibbons. Please see note for further details. Case discussed, plan agreed upon. Briefly, patient is a 77 y.o. female who presented to ED for right foot pain. DVT study ordered and pending at shift change. Will follow up on pending ultrasound. Per previous provider, if negative, will treat for cellulitis. If +, double dose of Lovenox and refer to hematology.   Ultrasound shows no evidence of DVT, but does show ruptured bakers cyst. This, along with bone spur, could very likely be causing her symptoms. Family also quite concerned for potential cellulitis. Will treat with keflex and have her follow up with orthopedics. Return precautions discussed and all questions answered.   Patient discussed with Dr. Darl Householder who agrees with treatment plan.    Aslan Montagna, Ozella Almond, PA-C 05/07/18 3403    Drenda Freeze, MD 05/07/18 [phone]

## 2018-05-18 ENCOUNTER — Ambulatory Visit (INDEPENDENT_AMBULATORY_CARE_PROVIDER_SITE_OTHER): Payer: Medicare Other

## 2018-05-18 VITALS — HR 96

## 2018-05-18 DIAGNOSIS — I499 Cardiac arrhythmia, unspecified: Secondary | ICD-10-CM | POA: Diagnosis not present

## 2018-05-18 NOTE — Progress Notes (Signed)
Received patient as a walk in from [place].  Note from Rogene Houston PA:  "Ms. Torrez is 23 days s/p TKA R. She had a bout of gout and was placed on prednisone and colchicine and gout is better but has had diarrhea and feeling weak.  Today, wound looks good, no infection, no DVT but pulse irregular. Please eval and get EKG to r/o Afib.    EKG completed and reviewed by Dr. Ellyn Hack (DOD)-no urgent recommendations. EKG, Sinus rhythm with frequent (bigeminy) PVCs and PACs rate 96.   Patient aware to call with any concerns.  Will make Dr. Percival Spanish aware. (f/u per OV note PRN)

## 2018-06-29 ENCOUNTER — Other Ambulatory Visit: Payer: Self-pay | Admitting: Internal Medicine

## 2018-06-29 DIAGNOSIS — R519 Headache, unspecified: Secondary | ICD-10-CM

## 2018-06-29 DIAGNOSIS — R51 Headache: Principal | ICD-10-CM

## 2018-09-24 IMAGING — DX DG ANKLE 2V *R*
2 series · 2 of 2 positions shown · non-contrast
Comparison: None.

CLINICAL DATA: Right foot pain. Knee replacement on [REDACTED]. Pain
and swelling developed on [REDACTED] night. History of left leg DVT.

EXAM:
RIGHT ANKLE - 2 VIEW

[ankle ap]
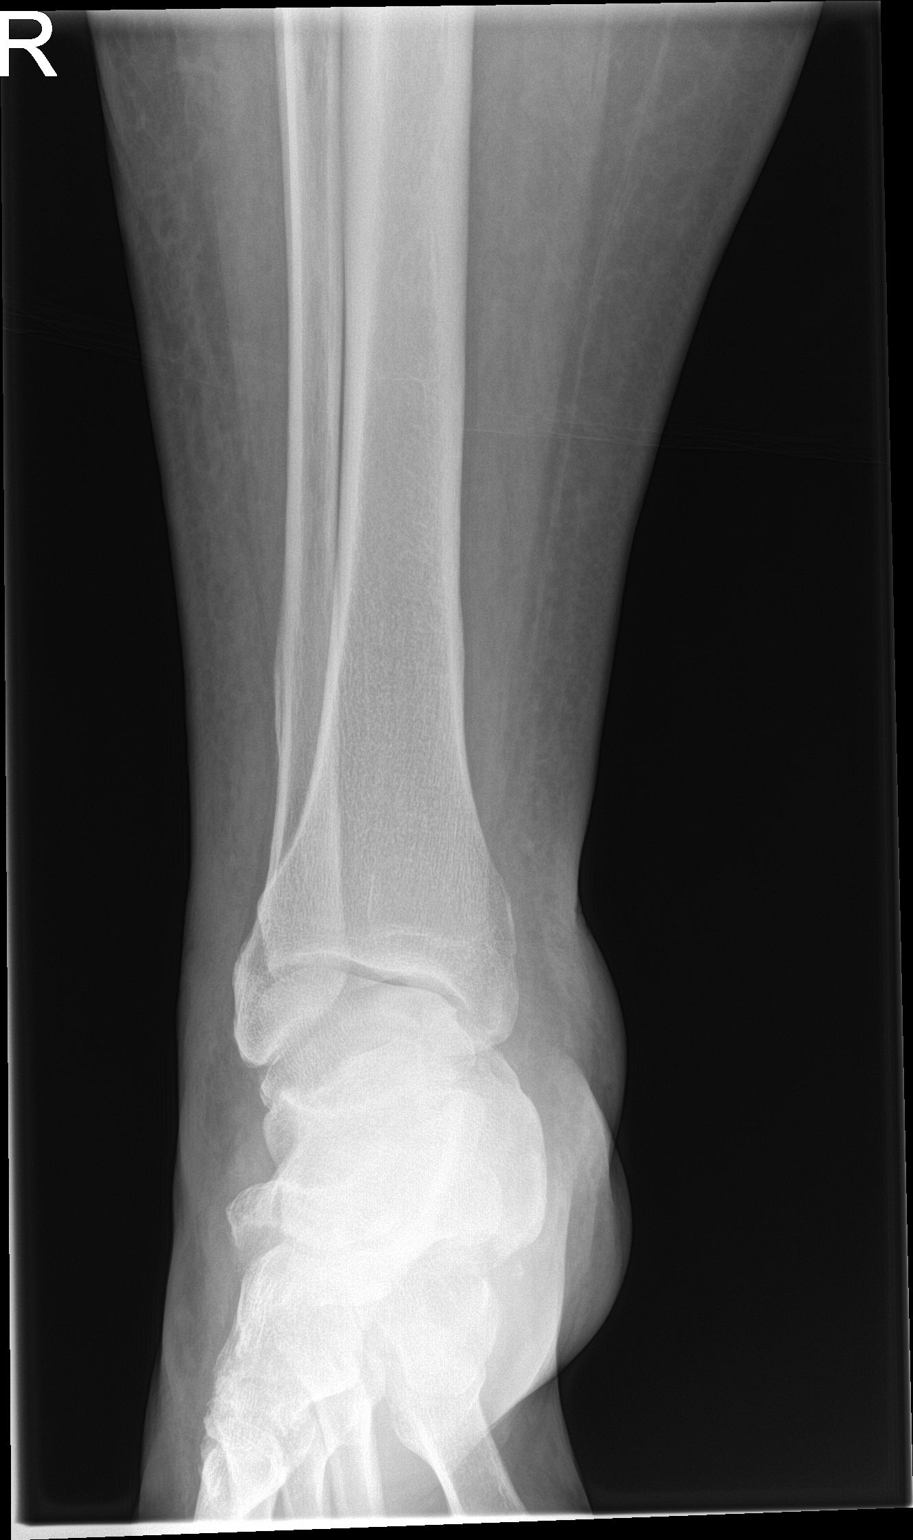

[ankle lat]
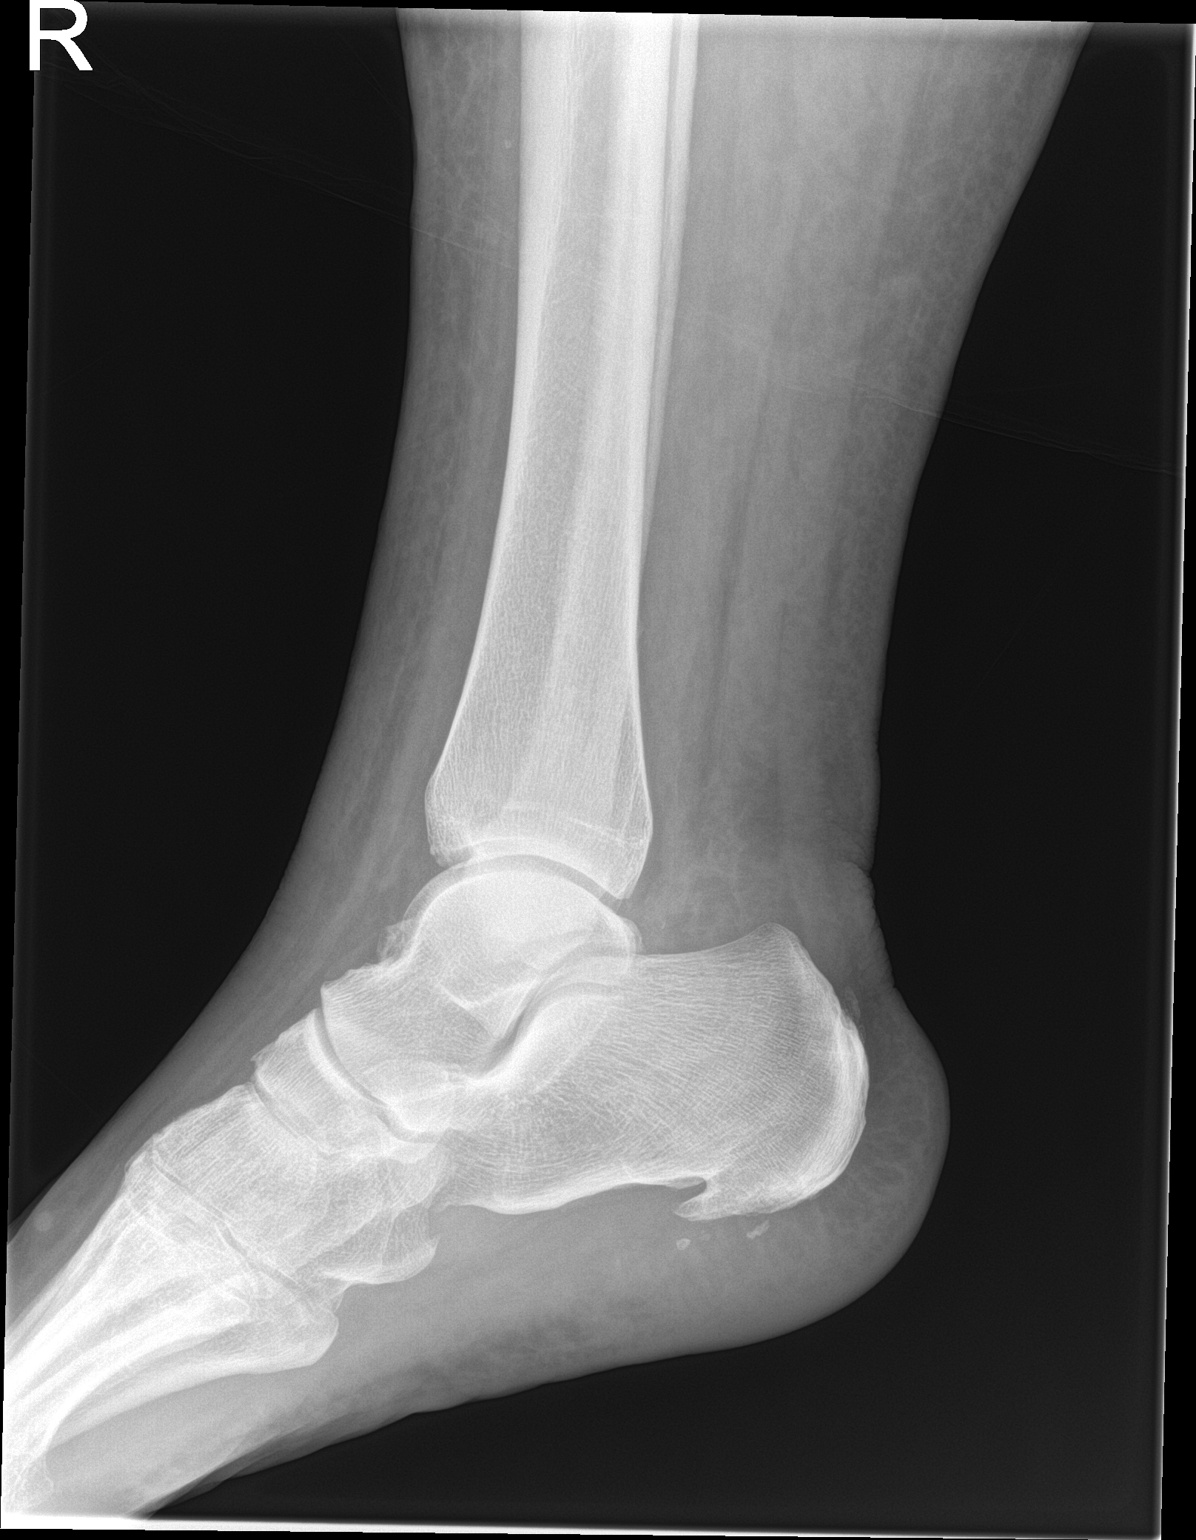

[2 of 2 positions shown; findings below may reference images not displayed]

FINDINGS: Mild soft tissue swelling along the right ankle. No evidence of
acute fracture or dislocation. No focal bone lesions. Moderate-sized
plantar calcaneal spur with small osseous fragments may indicate
plantar fasciitis.
IMPRESSION: No acute bony abnormalities. Soft tissue swelling. Prominent plantar
calcaneal spur with small osseous fragments may indicate plantar
fasciitis.

## 2018-12-13 DEATH — deceased
